# Patient Record
Sex: Male | Born: 1960 | Hispanic: Yes | Marital: Married | Smoking: Never smoker
Health system: Southern US, Community
[De-identification: ages and names within clinical notes are randomized; demographics above are authoritative.]

## PROBLEM LIST (undated history)

## (undated) DIAGNOSIS — T8859XA Other complications of anesthesia, initial encounter: Secondary | ICD-10-CM

## (undated) DIAGNOSIS — E785 Hyperlipidemia, unspecified: Secondary | ICD-10-CM

## (undated) DIAGNOSIS — T4145XA Adverse effect of unspecified anesthetic, initial encounter: Secondary | ICD-10-CM

## (undated) DIAGNOSIS — I10 Essential (primary) hypertension: Secondary | ICD-10-CM

## (undated) HISTORY — PX: VASECTOMY REVERSAL: SHX243

## (undated) HISTORY — DX: Adverse effect of unspecified anesthetic, initial encounter: T41.45XA

## (undated) HISTORY — DX: Other complications of anesthesia, initial encounter: T88.59XA

## (undated) HISTORY — PX: VASECTOMY: SHX75

## (undated) HISTORY — DX: Hyperlipidemia, unspecified: E78.5

## (undated) HISTORY — DX: Essential (primary) hypertension: I10

---

## 2017-03-18 ENCOUNTER — Ambulatory Visit: Payer: Self-pay | Admitting: Adult Health

## 2017-03-30 ENCOUNTER — Encounter: Payer: Self-pay | Admitting: Adult Health

## 2017-03-30 ENCOUNTER — Ambulatory Visit (INDEPENDENT_AMBULATORY_CARE_PROVIDER_SITE_OTHER): Payer: Managed Care, Other (non HMO) | Admitting: Adult Health

## 2017-03-30 VITALS — BP 130/90 | Temp 98.5°F | Ht 69.5 in | Wt 182.0 lb

## 2017-03-30 DIAGNOSIS — Z7689 Persons encountering health services in other specified circumstances: Secondary | ICD-10-CM

## 2017-03-30 DIAGNOSIS — Z1211 Encounter for screening for malignant neoplasm of colon: Secondary | ICD-10-CM | POA: Diagnosis not present

## 2017-03-30 DIAGNOSIS — R030 Elevated blood-pressure reading, without diagnosis of hypertension: Secondary | ICD-10-CM

## 2017-03-30 NOTE — Progress Notes (Signed)
Patient presents to clinic today to establish care. He is a pleasant 56 year old male who  has a past medical history of Essential hypertension and Hyperlipidemia.   His last physical was in 2016   He recently moved from [facility] to [place] for a job at [employer]. He works as Merchant navy officer    Acute Concerns: Establish Care  Chronic Issues: Essential Hypertension. He has been prescribed lisinopril 10 mg in the past but has not taken for multiple months as his prescription ran out.   Hyperlipidemia - Has not taken medication in the past   Health Maintenance: Dental -- Routine Care Vision -- Routine Care - Wears contacts  Immunizations -- UTD  Colonoscopy -- Never had one  Diet: He watches his carbs and tries to eat heart healthy  Exercise: [place] on the weekends    Past Medical History:  Diagnosis Date  . Essential hypertension   . Hyperlipidemia     No past surgical history on file.  No current outpatient prescriptions on file prior to visit.   No current facility-administered medications on file prior to visit.     No Known Allergies  Family History  Problem Relation Age of Onset  . Heart disease Mother   . Mitral valve prolapse Mother   . Prostate cancer Maternal Grandfather     Social History   Social History  . Marital status: Married    Spouse name: N/A  . Number of children: N/A  . Years of education: N/A   Occupational History  . [employer]   Social History Main Topics  . Smoking status: Never Smoker  . Smokeless tobacco: Never Used  . Alcohol use 1.2 oz/week    2 Cans of beer per week  . Drug use: Unknown  . Sexual activity: Not on file   Other Topics Concern  . Not on file   Social History Narrative  . No narrative on file    Review of Systems  Constitutional: Negative.   Respiratory: Negative.   Cardiovascular: Negative.   Genitourinary: Negative.     Musculoskeletal: Negative.   Neurological: Negative.   Psychiatric/Behavioral: Negative.   All other systems reviewed and are negative.   BP 130/90 (BP Location: Left Arm)   Temp 98.5 F (36.9 C) (Oral)   Ht 5' 9.5" (1.765 m)   Wt 182 lb (82.6 kg)   BMI 26.49 kg/m   Physical Exam  Constitutional: He is oriented to person, place, and time and well-developed, well-nourished, and in no distress. No distress.  Eyes: Pupils are equal, round, and reactive to light. Conjunctivae and EOM are normal. Right eye exhibits no discharge. Left eye exhibits no discharge. No scleral icterus.  Cardiovascular: Normal rate, regular rhythm, normal heart sounds and intact distal pulses.  Exam reveals no gallop and no friction rub.   No murmur heard. Pulmonary/Chest: Effort normal and breath sounds normal. No respiratory distress. He has no wheezes. He has no rales. He exhibits no tenderness.  Abdominal: Soft. Bowel sounds are normal. He exhibits no distension and no mass. There is no tenderness. There is no rebound and no guarding.  Musculoskeletal: Normal range of motion. He exhibits no edema, tenderness or deformity.  Neurological: He is alert and oriented to person, place, and time. Gait normal. GCS score is 15.  Skin: Skin is warm and dry. No rash noted. He is not diaphoretic. No erythema. No pallor.  Psychiatric:  Mood, memory, affect and judgment normal.  Nursing note and vitals reviewed.  Assessment/Plan: 1. Encounter to establish care - Follow up in November for CPE or sooner if needed  2. Elevated blood pressure reading - I am going to have him exercise more and we will watch his blood pressure - Monitor at home periodically   3. Colon cancer screening - He would like to do Cologuard    Shirline Freesory Charnette Younkin, NP

## 2017-03-30 NOTE — Patient Instructions (Signed)
It was a pleasure meeting you today   I do not think we need to put you back on blood pressure medication at this time. We will continue to watch it and start working on aerobic exercise.   Follow up in November for your physical.     Look at  [email or web address]   [place] [facility]

## 2017-09-09 ENCOUNTER — Encounter: Payer: Self-pay | Admitting: Adult Health

## 2017-09-09 ENCOUNTER — Encounter: Payer: Self-pay | Admitting: Gastroenterology

## 2017-09-09 ENCOUNTER — Ambulatory Visit (INDEPENDENT_AMBULATORY_CARE_PROVIDER_SITE_OTHER): Payer: Managed Care, Other (non HMO) | Admitting: Adult Health

## 2017-09-09 VITALS — BP 144/84 | Temp 97.3°F | Ht 70.0 in | Wt 184.0 lb

## 2017-09-09 DIAGNOSIS — Z1211 Encounter for screening for malignant neoplasm of colon: Secondary | ICD-10-CM

## 2017-09-09 DIAGNOSIS — Z23 Encounter for immunization: Secondary | ICD-10-CM

## 2017-09-09 DIAGNOSIS — I10 Essential (primary) hypertension: Secondary | ICD-10-CM

## 2017-09-09 DIAGNOSIS — N401 Enlarged prostate with lower urinary tract symptoms: Secondary | ICD-10-CM | POA: Diagnosis not present

## 2017-09-09 DIAGNOSIS — Z0000 Encounter for general adult medical examination without abnormal findings: Secondary | ICD-10-CM | POA: Diagnosis not present

## 2017-09-09 DIAGNOSIS — R3912 Poor urinary stream: Secondary | ICD-10-CM

## 2017-09-09 DIAGNOSIS — G479 Sleep disorder, unspecified: Secondary | ICD-10-CM | POA: Diagnosis not present

## 2017-09-09 MED ORDER — LISINOPRIL 10 MG PO TABS: 10.0000 mg | ORAL_TABLET | Freq: Every day | ORAL | 3 refills | Status: DC

## 2017-09-09 NOTE — Progress Notes (Signed)
Subjective:    Patient ID: Darius Lopez, male    DOB: 03-26-1961, 57 y.o.   MRN: [id number]  HPI  Patient presents for yearly preventative medicine examination. He is a pleasant 57 year old male who  has a past medical history of Essential hypertension and Hyperlipidemia.   Essential Hypertension - has been prescribed Lisinopril 10 mg in the past. He reports that he is not taking this medication and has not done so since October. Denies any headaches or blurred vision.   All immunizations and health maintenance protocols were reviewed with the patient and needed orders were placed. Needs tdap  Appropriate screening laboratory values were ordered for the patient including screening of hyperlipidemia, renal function and hepatic function.  Medication reconciliation,  past medical history, social history, problem list and allergies were reviewed in detail with the patient  Goals were established with regard to weight loss, exercise, and  diet in compliance with medications. He is very active outside. Does not follow a specific diet.   Insomnia - trouble staying asleep. He does not feel as though he has any trouble falling asleep. 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   Urinary issues - reports that recently he had noticed that he does not have as strong of a stream as he once had. He also reports frequency in the number of trips he has to make to the restroom. Endorses nocturia. Denies dysuria or hematuria.   Colon Cancer Screen - We ordered cologuard during the last visit but he reports that he never received the kit. He does not want to wait for cologuard. Would rather do colonoscopy    Review of Systems  Constitutional: Negative.   HENT: Negative.   Eyes: Negative.   Respiratory: Negative.   Cardiovascular: Negative.   Gastrointestinal: Negative.   Endocrine: Negative.   Genitourinary: Negative.   Musculoskeletal: Negative.   Skin: Negative.     Allergic/Immunologic: Negative.   Neurological: Negative.   Hematological: Negative.   Psychiatric/Behavioral: Negative.   All other systems reviewed and are negative.    Past Medical History:  Diagnosis Date  . Essential hypertension   . Hyperlipidemia     Social History   Socioeconomic History  . Marital status: Married    Spouse name: Not on file  . Number of children: Not on file  . Years of education: Not on file  . Highest education level: Not on file  Social Needs  . Financial resource strain: Not on file  . Food insecurity - worry: Not on file  . Food insecurity - inability: Not on file  . Transportation needs - medical: Not on file  . Transportation needs - non-medical: Not on file  Occupational History  . Occupation: Social research officer, government: [place]  Tobacco Use  . Smoking status: Never Smoker  . Smokeless tobacco: Never Used  Substance and Sexual Activity  . Alcohol use: Yes    Alcohol/week: 1.2 oz    Types: 2 Cans of beer per week  . Drug use: Not on file  . Sexual activity: Not on file  Other Topics Concern  . Not on file  Social History Narrative   Married   Has three children ( 31, 68. 71)    Likes to [place] and do [place]    History reviewed. No pertinent surgical history.  Family History  Problem Relation Age of Onset  . Heart disease Mother   . Mitral valve prolapse  Mother   . Prostate cancer Maternal Grandfather     No Known Allergies  No current outpatient medications on file prior to visit.   No current facility-administered medications on file prior to visit.     BP (!) 144/84 (BP Location: Left Arm)   Temp (!) 97.3 F (36.3 C) (Oral)   Ht 5' 10"  (1.778 m)   Wt 184 lb (83.5 kg)   BMI 26.40 kg/m       Objective:   Physical Exam  Constitutional: He is oriented to person, place, and time. He appears well-developed and well-nourished. No distress.  HENT:  Head: Normocephalic and atraumatic.  Right Ear: External ear  normal.  Left Ear: External ear normal.  Nose: Nose normal.  Mouth/Throat: Oropharynx is clear and moist. No oropharyngeal exudate.  Eyes: Conjunctivae and EOM are normal. Pupils are equal, round, and reactive to light. Right eye exhibits no discharge. Left eye exhibits no discharge. No scleral icterus.  Neck: Normal range of motion. Neck supple. No JVD present. No tracheal deviation present. No thyromegaly present.  Cardiovascular: Normal rate, regular rhythm, normal heart sounds and intact distal pulses. Exam reveals no gallop and no friction rub.  No murmur heard. Pulmonary/Chest: Effort normal and breath sounds normal. No stridor. No respiratory distress. He has no wheezes. He has no rales. He exhibits no tenderness.  Abdominal: Soft. Bowel sounds are normal. He exhibits no distension and no mass. There is no tenderness. There is no rebound and no guarding.  Genitourinary: Rectum normal. Prostate is enlarged. Prostate is not tender.  Musculoskeletal: Normal range of motion. He exhibits no edema, tenderness or deformity.  Lymphadenopathy:    He has no cervical adenopathy.  Neurological: He is alert and oriented to person, place, and time. He has normal reflexes. He displays normal reflexes. No cranial nerve deficit. He exhibits normal muscle tone. Coordination normal.  Skin: Skin is warm and dry. No rash noted. He is not diaphoretic. No erythema. No pallor.  Psychiatric: He has a normal mood and affect. His behavior is normal. Judgment and thought content normal.  Nursing note and vitals reviewed.     Assessment & Plan:  1. Routine general medical examination at a health care facility  - Basic metabolic panel; Future - CBC with Differential/Platelet; Future - Hemoglobin A1c; Future - Hepatic function panel; Future - Lipid panel; Future - PSA; Future - TSH; Future - POCT Urinalysis Dipstick (Automated); Future  2. Essential hypertension - Needs to start taking his medication on a  daily basis.  - lisinopril (PRINIVIL,ZESTRIL) 10 MG tablet; Take 1 tablet (10 mg total) by mouth daily.  Dispense: 90 tablet; Refill: 3  3. Colon cancer screening  - Ambulatory referral to Gastroenterology; Future  4. Need for Tdap vaccination  - Tdap vaccine greater than or equal to 7yo IM  5. Sleep disturbance - likely due to stress at work  - Advised Melatonin 69m   6. Benign prostatic hyperplasia with weak urinary stream - Symptoms present  - he does not want any medication at this time   CDorothyann Peng NP

## 2017-09-19 ENCOUNTER — Other Ambulatory Visit: Payer: Managed Care, Other (non HMO)

## 2017-09-26 ENCOUNTER — Other Ambulatory Visit (INDEPENDENT_AMBULATORY_CARE_PROVIDER_SITE_OTHER): Payer: Managed Care, Other (non HMO)

## 2017-09-26 DIAGNOSIS — Z125 Encounter for screening for malignant neoplasm of prostate: Secondary | ICD-10-CM

## 2017-09-26 DIAGNOSIS — Z0000 Encounter for general adult medical examination without abnormal findings: Secondary | ICD-10-CM

## 2017-09-26 LAB — LIPID PANEL
CHOLESTEROL: 261 mg/dL — AB (ref 0–200)
HDL: 28.3 mg/dL — AB (ref >39.00)
Total CHOL/HDL Ratio: 9

## 2017-09-26 LAB — HEPATIC FUNCTION PANEL
ALT: 27 U/L (ref 0–53)
AST: 17 U/L (ref 0–37)
Albumin: 4.6 g/dL (ref 3.5–5.2)
Alkaline Phosphatase: 42 U/L (ref 39–117)
BILIRUBIN TOTAL: 1.4 mg/dL — AB (ref 0.2–1.2)
Bilirubin, Direct: 0.2 mg/dL (ref 0.0–0.3)
Total Protein: 7.3 g/dL (ref 6.0–8.3)

## 2017-09-26 LAB — CBC WITH DIFFERENTIAL/PLATELET
BASOS PCT: 0.8 %
Basophils Absolute: 49 cells/uL (ref 0–200)
EOS PCT: 4.1 %
Eosinophils Absolute: 250 cells/uL (ref 15–500)
HEMATOCRIT: 48 % (ref 38.5–50.0)
Hemoglobin: 16.1 g/dL (ref 13.2–17.1)
LYMPHS ABS: 1830 {cells}/uL (ref 850–3900)
MCH: 29 pg (ref 27.0–33.0)
MCHC: 33.5 g/dL (ref 32.0–36.0)
MCV: 86.5 fL (ref 80.0–100.0)
MONOS PCT: 8.4 %
MPV: 11.6 fL (ref 7.5–12.5)
NEUTROS ABS: 3459 {cells}/uL (ref 1500–7800)
Neutrophils Relative %: 56.7 %
Platelets: 231 10*3/uL (ref 140–400)
RBC: 5.55 10*6/uL (ref 4.20–5.80)
RDW: 11.8 % (ref 11.0–15.0)
Total Lymphocyte: 30 %
WBC mixed population: 512 cells/uL (ref 200–950)
WBC: 6.1 10*3/uL (ref 3.8–10.8)

## 2017-09-26 LAB — POC URINALSYSI DIPSTICK (AUTOMATED)
BILIRUBIN UA: NEGATIVE
Blood, UA: NEGATIVE
GLUCOSE UA: NEGATIVE
KETONES UA: NEGATIVE
LEUKOCYTES UA: NEGATIVE
Nitrite, UA: NEGATIVE
Protein, UA: NEGATIVE
Urobilinogen, UA: 0.2 E.U./dL
pH, UA: 5.5 (ref 5.0–8.0)

## 2017-09-26 LAB — HEMOGLOBIN A1C: HEMOGLOBIN A1C: 4.9 % (ref 4.6–6.5)

## 2017-09-26 LAB — BASIC METABOLIC PANEL
BUN: 20 mg/dL (ref 6–23)
CALCIUM: 9.4 mg/dL (ref 8.4–10.5)
CO2: 28 meq/L (ref 19–32)
Chloride: 102 mEq/L (ref 96–112)
Creatinine, Ser: 1.02 mg/dL (ref 0.40–1.50)
GFR: 80.59 mL/min (ref >60.00)
Glucose, Bld: 107 mg/dL — ABNORMAL HIGH (ref 70–99)
POTASSIUM: 4.6 meq/L (ref 3.5–5.1)
SODIUM: 139 meq/L (ref 135–145)

## 2017-09-26 LAB — PSA: PSA: 0.75 ng/mL (ref 0.10–4.00)

## 2017-09-26 LAB — TSH: TSH: 1.63 u[IU]/mL (ref 0.35–4.50)

## 2017-09-26 LAB — LDL CHOLESTEROL, DIRECT: LDL DIRECT: 105 mg/dL

## 2017-09-28 ENCOUNTER — Other Ambulatory Visit: Payer: Self-pay | Admitting: Family Medicine

## 2017-09-28 MED ORDER — ATORVASTATIN CALCIUM 20 MG PO TABS: 20.0000 mg | ORAL_TABLET | Freq: Every day | ORAL | 3 refills | Status: DC

## 2017-09-28 NOTE — Telephone Encounter (Signed)
Sent to the pharmacy by e-scribe. 

## 2017-10-07 ENCOUNTER — Telehealth: Payer: Self-pay

## 2017-10-07 ENCOUNTER — Ambulatory Visit (AMBULATORY_SURGERY_CENTER): Payer: Self-pay

## 2017-10-07 VITALS — Ht 71.0 in | Wt 181.0 lb

## 2017-10-07 DIAGNOSIS — Z1211 Encounter for screening for malignant neoplasm of colon: Secondary | ICD-10-CM

## 2017-10-07 MED ORDER — NA SULFATE-K SULFATE-MG SULF 17.5-3.13-1.6 GM/177ML PO SOLN: 1.0000 | Freq: Once | ORAL | 0 refills | Status: AC

## 2017-10-07 NOTE — Progress Notes (Signed)
Per pt, no allergies to soy or egg products.Pt not taking any weight loss meds or using  O2 at home.  Per pt, he states 20 years ago in [place], the anesthesiologist said he was hard to intubate prior to his vasectomy surgery. The pt does not know what happened and has not had surgery since then. Sent note to  Intel CorporationJohn Lopez.  Pt refused emmi video.

## 2017-10-07 NOTE — Telephone Encounter (Signed)
Darius Lopez,  This pt is scheduled for his colonoscopy with Dr Myrtie Neitheranis on 10/21/17. During the PV, the pt informed me that the anesthesiologist in [place], told him he was hard to intubate prior to his vasectomy surgery over 20 years ago. The pt does not know what happened and has not had any surgery since then. Is pt OK for [facility]? Please advise. Thanks, Cherylann RatelJanis (PV)

## 2017-10-11 ENCOUNTER — Encounter: Payer: Self-pay | Admitting: Gastroenterology

## 2017-10-11 MED ORDER — NA SULFATE-K SULFATE-MG SULF 17.5-3.13-1.6 GM/177ML PO SOLN: 1.0000 | Freq: Once | ORAL | 0 refills | Status: AC

## 2017-10-11 NOTE — Telephone Encounter (Signed)
Whether or not you can review old records, if you determine that he [facility], please give us as much notice as possible in order to reschedule.

## 2017-10-11 NOTE — Telephone Encounter (Signed)
Darius Lopez,  I spoke to Mr. Darius Lopez and he will attempt to obtain his medical records.  Should he get them I will review and determine if [facility]  Thanks,  Darius Lopez

## 2017-10-11 NOTE — Telephone Encounter (Signed)
Darius Lopez,  I left a message for this pt and await his call back.  Thanks,  Darius Lopez

## 2017-10-17 ENCOUNTER — Telehealth: Payer: Self-pay | Admitting: Gastroenterology

## 2017-10-17 NOTE — Telephone Encounter (Signed)
Please cancel this patient's colonoscopy in [facility] this Friday March 8th.  Let [facility] know as well. Anesthesia is getting records for questionable history of difficult intubation in order to determine where patient should have his procedure in the future. Darius ParsonsJohn Nulty from anesthesia has spoken with the patient,who is aware of this plan.

## 2017-10-17 NOTE — Telephone Encounter (Signed)
Tama HighHey John... Just trying to follow up on the previous message. Thank you, Mayrene Bastarache PV

## 2017-10-18 NOTE — Telephone Encounter (Signed)
Procedure has been cancelled.

## 2017-10-18 NOTE — Telephone Encounter (Signed)
Robbin,  I spoke with Mr. Darius Lopez yesterday.  He reports the facility where he had his surgical procedure is sending the medical records.  Upon receipt he will deliver them to pre-visit.  Please call me when they arrive so I can review them.  In the mean time he will not have his colonoscopy this Friday.  I discussed this change with Dr. Myrtie Neitheranis and Mr. Darius Lopez yesterday.  Once I have reviewed the medical records his colonoscopy will be rescheduled at the appropriate venue.  Could someone call him again to reinforce what I just outlined so to avoid any confusion on his part?  Thanks,  Cathlyn ParsonsJohn Rutha Melgoza

## 2017-10-18 NOTE — Telephone Encounter (Signed)
Spoke with pt earlier and reinforced what Darius Lopez had advised for him to do. Pt states it may be over a week before he receives his medical records, but will bring the records to the 4th floor for review as soon as he receives them.  Colon on Friday was already cx'd, pt was aware. He will call back if he has any further questions. [name] (PV)

## 2017-10-21 ENCOUNTER — Encounter: Payer: Managed Care, Other (non HMO) | Admitting: Gastroenterology

## 2017-11-14 NOTE — Telephone Encounter (Signed)
Sherrilyn Ristanis, Henry L III, MD  Cathlyn ParsonsNulty, John, CRNA; Lucia GaskinsMyers, Robbin H, RN        Thank you, Ileana LaddJohn   Robbin, please inform patient and schedule his colonoscopy in one of my [facility] outpatient blocks. Late May and June are currently available I believe.   Thanks   - HD   Previous Messages    ----- Message -----  From: Cathlyn ParsonsNulty, John, CRNA  Sent: 11/14/2017 11:22 AM  To: Lucia Gaskinsobbin H Myers, RN, Sherrilyn RistHenry L Danis III, MD   Robbin,   Just spoke to Darius Lopez. He reports the facility whom we are waiting for records has not located them and they will not be sent. Consequently he will need to have his procedure in the hospital setting.   Thanks much,   Cathlyn ParsonsJohn Nulty   ----- Message -----  From: Lucia GaskinsMyers, Robbin H, RN  Sent: 10/17/2017  7:29 AM  To: Cathlyn ParsonsJohn Nulty, CRNA   Good morning!! I was just following up on this patient. Please let me know what to do for him. Thank you!! Robbin pv

## 2017-11-15 ENCOUNTER — Other Ambulatory Visit: Payer: Self-pay

## 2017-11-15 DIAGNOSIS — Z1211 Encounter for screening for malignant neoplasm of colon: Secondary | ICD-10-CM

## 2017-11-15 NOTE — Telephone Encounter (Signed)
Patient notified by phone of Previsit and Colonoscopy appts.  His insurance is changing 12-14-17.

## 2017-11-15 NOTE — Telephone Encounter (Signed)
Mailbox is full and not accepting messages.  Will continue to attempt to reach patient by phone.

## 2017-11-15 NOTE — Telephone Encounter (Signed)
Col[facility]10[facility]18 [facility]  Previsit on 12/15/17 at 3:30pm.  Will notify patient by phone when mailbox is accepting messages.

## 2017-12-15 ENCOUNTER — Other Ambulatory Visit: Payer: Self-pay

## 2017-12-15 ENCOUNTER — Ambulatory Visit (AMBULATORY_SURGERY_CENTER): Payer: Self-pay | Admitting: *Deleted

## 2017-12-15 VITALS — Ht 71.0 in | Wt 179.0 lb

## 2017-12-15 DIAGNOSIS — Z1211 Encounter for screening for malignant neoplasm of colon: Secondary | ICD-10-CM

## 2017-12-15 MED ORDER — PEG-KCL-NACL-NASULF-NA ASC-C 140 G PO SOLR: 1.0000 | ORAL | 0 refills | Status: DC

## 2017-12-15 NOTE — Progress Notes (Signed)
No egg or soy allergy known to patient  No issues with past sedation with any surgeries  or procedures,  intubation problems so patient is having at [facility] No diet pills per patient No home 02 use per patient  No blood thinners per patient  Pt denies issues with constipation  No A fib or A flutter  EMMI video sent to pt's e mail

## 2017-12-27 ENCOUNTER — Telehealth: Payer: Self-pay | Admitting: Adult Health

## 2017-12-27 NOTE — Telephone Encounter (Signed)
Copied from CRM [id number]. Topic: Quick Communication - Rx Refill/Question >> Dec 27, 2017  2:40 PM Arlyss Gandy, NT wrote: Medication: atorvastatin (LIPITOR) 20 MG tablet  Has the patient contacted their pharmacy? Yes.   (Agent: If no, request that the patient contact the pharmacy for the refill.) Preferred Pharmacy (with phone number or street name): [id number] - [place], [place] - [address] [phone] (Phone) [phone] (Fax)     Agent: Please be advised that RX refills may take up to 3 business days. We ask that you follow-up with your pharmacy.

## 2017-12-27 NOTE — Telephone Encounter (Signed)
[facility], pt has refills and they will contact the patient.

## 2018-01-04 ENCOUNTER — Other Ambulatory Visit: Payer: Self-pay

## 2018-01-04 ENCOUNTER — Telehealth: Payer: Self-pay | Admitting: Gastroenterology

## 2018-01-04 NOTE — Telephone Encounter (Signed)
Patient rescheduled to next available date at [facility] on 6/21. Mailed new prep instructions, note sent to Amy H. about new date.

## 2018-01-17 ENCOUNTER — Telehealth: Payer: Self-pay

## 2018-01-17 NOTE — Telephone Encounter (Signed)
Spoke to patient to verify his address. Address corrected in Epic system. Remailed prep instructions, patient understands to come by office if he does not receive it and pick up another copy.

## 2018-02-03 ENCOUNTER — Ambulatory Visit (HOSPITAL_COMMUNITY): Payer: Managed Care, Other (non HMO) | Admitting: Certified Registered Nurse Anesthetist

## 2018-02-03 ENCOUNTER — Encounter (HOSPITAL_COMMUNITY)
Admission: RE | Disposition: A | Discharge status: Home / Self Care | Payer: Self-pay | Source: Ambulatory Visit | Attending: Gastroenterology

## 2018-02-03 ENCOUNTER — Ambulatory Visit (HOSPITAL_COMMUNITY)
Admission: RE | Admit: 2018-02-03 | Discharge: 2018-02-03 | Disposition: A | Discharge status: Home / Self Care | Payer: Managed Care, Other (non HMO) | Source: Ambulatory Visit | Attending: Gastroenterology | Admitting: Gastroenterology

## 2018-02-03 ENCOUNTER — Other Ambulatory Visit: Payer: Self-pay

## 2018-02-03 ENCOUNTER — Encounter (HOSPITAL_COMMUNITY): Payer: Self-pay

## 2018-02-03 DIAGNOSIS — I10 Essential (primary) hypertension: Secondary | ICD-10-CM | POA: Insufficient documentation

## 2018-02-03 DIAGNOSIS — E785 Hyperlipidemia, unspecified: Secondary | ICD-10-CM | POA: Insufficient documentation

## 2018-02-03 DIAGNOSIS — Z1211 Encounter for screening for malignant neoplasm of colon: Secondary | ICD-10-CM

## 2018-02-03 HISTORY — PX: COLONOSCOPY: SHX5424

## 2018-02-03 SURGERY — COLONOSCOPY
Anesthesia: Monitor Anesthesia Care

## 2018-02-03 MED ORDER — PROPOFOL 10 MG/ML IV BOLUS
INTRAVENOUS | Status: DC | PRN
  Administered 2018-02-03: 40 mg via INTRAVENOUS

## 2018-02-03 MED ORDER — SODIUM CHLORIDE 0.9 % IV SOLN: INTRAVENOUS | Status: DC

## 2018-02-03 MED ORDER — LIDOCAINE 2% (20 MG/ML) 5 ML SYRINGE
INTRAMUSCULAR | Status: DC | PRN
  Administered 2018-02-03: 100 mg via INTRAVENOUS

## 2018-02-03 MED ORDER — PROPOFOL 500 MG/50ML IV EMUL
INTRAVENOUS | Status: DC | PRN
  Administered 2018-02-03: 100 ug/kg/min via INTRAVENOUS

## 2018-02-03 MED ORDER — PROPOFOL 10 MG/ML IV BOLUS
INTRAVENOUS | Status: AC
  Filled 2018-02-03: qty 60

## 2018-02-03 MED ORDER — LACTATED RINGERS IV SOLN
INTRAVENOUS | Status: DC | PRN
  Administered 2018-02-03: 08:00:00 via INTRAVENOUS

## 2018-02-03 NOTE — H&P (Signed)
  History:  This patient presents for endoscopic testing for colon cancer screening.  Darius Lopez Referring physician: Shirline Lopez, Cory, NP  Past Medical History: Past Medical History:  Diagnosis Date  . Complication of anesthesia    "Hard to intubate"per pt/per anesthesiologist in [place] over 20 years ago!  . Essential hypertension   . Hyperlipidemia      Past Surgical History: Past Surgical History:  Procedure Laterality Date  . VASECTOMY     and then vasectomy reversal in 2001  . VASECTOMY REVERSAL      Allergies: No Known Allergies  Outpatient Meds: Current Facility-Administered Medications  Medication Dose Route Frequency Provider Last Rate Last Dose  . 0.9 %  sodium chloride infusion   Intravenous Continuous Danis, Starr LakeHenry L III, MD          ___________________________________________________________________ Objective   Exam:  BP 122/85   Pulse (!) 58   Temp 97.9 F (36.6 C) (Oral)   Resp 15   Ht 5\' 11"  (1.803 m)   Wt 177 lb (80.3 kg)   SpO2 100%   BMI 24.69 kg/m    CV: RRR without murmur, S1/S2, no JVD, no peripheral edema  Resp: clear to auscultation bilaterally, normal RR and effort noted  GI: soft, no tenderness, with active bowel sounds. No guarding or palpable organomegaly noted.  Neuro: awake, alert and oriented x 3. Normal gross motor function and fluent speech   Assessment:  Colon cancer screening  Plan:  colonoscopy   Charlie PitterHenry L Danis III

## 2018-02-03 NOTE — Transfer of Care (Signed)
Immediate Anesthesia Transfer of Care Note  Patient: Darius Lopez  Procedure(s) Performed: COLONOSCOPY (N/A )  Patient Location: PACU  Anesthesia Type:MAC  Level of Consciousness: awake, alert  and oriented  Airway & Oxygen Therapy: Patient Spontanous Breathing and Patient connected to face mask oxygen  Post-op Assessment: Report given to RN  Post vital signs: Reviewed and stable  Last Vitals:  Vitals Value Taken Time  BP 133/81 02/03/2018  8:40 AM  Temp    Pulse 60 02/03/2018  8:41 AM  Resp 14 02/03/2018  8:41 AM  SpO2 100 % 02/03/2018  8:41 AM  Vitals shown include unvalidated device data.  Last Pain:  Vitals:   02/03/18 0717  TempSrc: Oral  PainSc:          Complications: No apparent anesthesia complications

## 2018-02-03 NOTE — Discharge Instructions (Signed)
YOU HAD AN ENDOSCOPIC PROCEDURE TODAY: Refer to the procedure report and other information in the discharge instructions given to you for any specific questions about what was found during the examination. If this information does not answer your questions, please call [facility] office at [phone] to clarify.  ° °YOU SHOULD EXPECT: Some feelings of bloating in the abdomen. Passage of more gas than usual. Walking can help get rid of the air that was put into your GI tract during the procedure and reduce the bloating. If you had a lower endoscopy (such as a colonoscopy or flexible sigmoidoscopy) you may notice spotting of blood in your stool or on the toilet paper. Some abdominal soreness may be present for a day or two, also. ° °DIET: Your first meal following the procedure should be a light meal and then it is ok to progress to your normal diet. A half-sandwich or bowl of soup is an example of a good first meal. Heavy or fried foods are harder to digest and may make you feel nauseous or bloated. Drink plenty of fluids but you should avoid alcoholic beverages for 24 hours. If you had a esophageal dilation, please see attached instructions for diet.   ° °ACTIVITY: Your care partner should take you home directly after the procedure. You should plan to take it easy, moving slowly for the rest of the day. You can resume normal activity the day after the procedure however YOU SHOULD NOT DRIVE, use power tools, machinery or perform tasks that involve climbing or major physical exertion for 24 hours (because of the sedation medicines used during the test).  ° °SYMPTOMS TO REPORT IMMEDIATELY: °A gastroenterologist can be reached at any hour. Please call [phone]  for any of the following symptoms:  °Following lower endoscopy (colonoscopy, flexible sigmoidoscopy) °Excessive amounts of blood in the stool  °Significant tenderness, worsening of abdominal pains  °Swelling of the abdomen that is new, acute  °Fever of 100° or  higher  °Following upper endoscopy (EGD, EUS, ERCP, esophageal dilation) °Vomiting of blood or coffee ground material  °New, significant abdominal pain  °New, significant chest pain or pain under the shoulder blades  °Painful or persistently difficult swallowing  °New shortness of breath  °Black, tarry-looking or red, bloody stools ° °FOLLOW UP:  °If any biopsies were taken you will be contacted by phone or by letter within the next 1-3 weeks. Call [phone]  if you have not heard about the biopsies in 3 weeks.  °Please also call with any specific questions about appointments or follow up tests. ° °

## 2018-02-03 NOTE — Anesthesia Preprocedure Evaluation (Signed)
Anesthesia Evaluation  Patient identified by MRN, date of birth, ID band Patient awake    Reviewed: Allergy & Precautions, NPO status , Patient's Chart, lab work & pertinent test results  History of Anesthesia Complications (+) DIFFICULT AIRWAY and history of anesthetic complications  Airway Mallampati: III       Dental no notable dental hx. (+) Teeth Intact   Pulmonary neg pulmonary ROS,    Pulmonary exam normal breath sounds clear to auscultation       Cardiovascular hypertension, Pt. on medications Normal cardiovascular exam Rhythm:Regular Rate:Normal     Neuro/Psych negative neurological ROS  negative psych ROS   GI/Hepatic negative GI ROS, Neg liver ROS,   Endo/Other  negative endocrine ROS  Renal/GU negative Renal ROS  negative genitourinary   Musculoskeletal negative musculoskeletal ROS (+)   Abdominal Normal abdominal exam  (+)   Peds  Hematology negative hematology ROS (+)   Anesthesia Other Findings   Reproductive/Obstetrics                             Anesthesia Physical Anesthesia Plan  ASA: II  Anesthesia Plan: MAC   Post-op Pain Management:    Induction:   PONV Risk Score and Plan:   Airway Management Planned: Natural Airway, Nasal Cannula and Simple Face Mask  Additional Equipment:   Intra-op Plan:   Post-operative Plan:   Informed Consent: I have reviewed the patients History and Physical, chart, labs and discussed the procedure including the risks, benefits and alternatives for the proposed anesthesia with the patient or authorized representative who has indicated his/her understanding and acceptance.     Plan Discussed with: CRNA  Anesthesia Plan Comments:         Anesthesia Quick Evaluation

## 2018-02-03 NOTE — Interval H&P Note (Signed)
History and Physical Interval Note:  02/03/2018 8:01 AM  Darius Lopez  has presented today for surgery, with the diagnosis of Screening for colon cancer  The various methods of treatment have been discussed with the patient and family. After consideration of risks, benefits and other options for treatment, the patient has consented to  Procedure(s): COLONOSCOPY (N/A) as a surgical intervention .  The patient's history has been reviewed, patient examined, no change in status, stable for surgery.  I have reviewed the patient's chart and labs.  Questions were answered to the patient's satisfaction.     Darius Lopez

## 2018-02-03 NOTE — Op Note (Signed)
[facility] Patient Name: Darius Lopez Procedure Date: 02/03/2018 MRN: [id number] Attending MD: Starr Lake. Myrtie Neither , MD Date of Birth: [birth year] CSN: [id number] Age: 57 Admit Type: Inpatient Procedure:                Colonoscopy Indications:              Screening for colorectal malignant neoplasm, This                            is the patient's first colonoscopy Providers:                Sherilyn Cooter L. Myrtie Neither, MD, Clearnce Sorrel, RN, Margo Aye, Technician, Stephanie [place], CRNA Referring MD:             Shirline Frees, NP Medicines:                Monitored Anesthesia Care Complications:            No immediate complications. Estimated Blood Loss:     Estimated blood loss: none. Procedure:                Pre-Anesthesia Assessment:                           - Prior to the procedure, a History and Physical                            was performed, and patient medications and                            allergies were reviewed. The patient's tolerance of                            previous anesthesia was also reviewed. The risks                            and benefits of the procedure and the sedation                            options and risks were discussed with the patient.                            All questions were answered, and informed consent                            was obtained. Prior Anticoagulants: The patient has                            taken no previous anticoagulant or antiplatelet                            agents. ASA Grade Assessment: II - A patient with  mild systemic disease. After reviewing the risks                            and benefits, the patient was deemed in                            satisfactory condition to undergo the procedure.                           After obtaining informed consent, the colonoscope                            was passed under direct vision. Throughout the                    procedure, the patient's blood pressure, pulse, and                            oxygen saturations were monitored continuously. The                            EC-[id number] ([id number]) scope was introduced through                            the anus and advanced to the the cecum, identified                            by appendiceal orifice and ileocecal valve. The                            colonoscopy was performed without difficulty. The                            patient tolerated the procedure well. The quality                            of the bowel preparation was excellent. The                            ileocecal valve, appendiceal orifice, and rectum                            were photographed. Scope In: 8:20:53 AM Scope Out: 8:37:22 AM Scope Withdrawal Time: 0 hours 13 minutes 25 seconds  Total Procedure Duration: 0 hours 16 minutes 29 seconds  Findings:      The perianal and digital rectal examinations were normal.      The entire examined colon appeared normal on direct and retroflexion       views. Impression:               - The entire examined colon is normal on direct and                            retroflexion views.                           -  No specimens collected. Moderate Sedation:      N/A- Per Anesthesia Care Recommendation:           - Patient has a contact number available for                            emergencies. The signs and symptoms of potential                            delayed complications were discussed with the                            patient. Return to normal activities tomorrow.                            Written discharge instructions were provided to the                            patient.                           - Resume previous diet.                           - Continue present medications.                           - Repeat colonoscopy in 10 years for screening                            purposes. Procedure Code(s):         --- Professional ---                           [id number], Colonoscopy, flexible; diagnostic, including                            collection of specimen(s) by brushing or washing,                            when performed (separate procedure) Diagnosis Code(s):        --- Professional ---                           Z12.11, Encounter for screening for malignant                            neoplasm of colon CPT copyright 2017 American Medical Association. All rights reserved. The codes documented in this report are preliminary and upon coder review may  be revised to meet current compliance requirements.  L. Myrtie Neitheranis, MD 02/03/2018 8:40:43 AM This report has been signed electronically. Number of Addenda: 0

## 2018-02-06 ENCOUNTER — Encounter (HOSPITAL_COMMUNITY): Payer: Self-pay | Admitting: Gastroenterology

## 2018-02-07 NOTE — Anesthesia Postprocedure Evaluation (Addendum)
Anesthesia Post Note  Patient: Darius Lopez  Procedure(s) Performed: COLONOSCOPY (N/A )     Patient location during evaluation: Endoscopy Anesthesia Type: MAC Level of consciousness: awake Pain management: pain level controlled Vital Signs Assessment: post-procedure vital signs reviewed and stable Respiratory status: spontaneous breathing Cardiovascular status: stable Postop Assessment: no apparent nausea or vomiting Anesthetic complications: no    Last Vitals:  Vitals:   02/03/18 0840 02/03/18 0850  BP: 133/81 121/74  Pulse: 60 (!) 59  Resp: 17 18  Temp: (!) 36.4 C   SpO2: 100% 100%    Last Pain:  Vitals:   02/03/18 0850  TempSrc:   PainSc: 0-No pain   Pain Goal:                 Brentt Fread JR,JOHN Rashad Auld

## 2018-05-12 ENCOUNTER — Ambulatory Visit: Payer: Managed Care, Other (non HMO) | Admitting: Adult Health

## 2018-05-12 ENCOUNTER — Encounter: Payer: Self-pay | Admitting: Adult Health

## 2018-05-12 VITALS — BP 118/80 | HR 75 | Temp 98.4°F | Wt 178.2 lb

## 2018-05-12 DIAGNOSIS — M7550 Bursitis of unspecified shoulder: Secondary | ICD-10-CM | POA: Diagnosis not present

## 2018-05-12 MED ORDER — METHYLPREDNISOLONE ACETATE 80 MG/ML IJ SUSP
80.0000 mg | Freq: Once | INTRAMUSCULAR | Status: AC
Start: 2018-05-12 — End: 2018-05-12
  Administered 2018-05-12: 80 mg via INTRA_ARTICULAR

## 2018-05-12 NOTE — Progress Notes (Signed)
Subjective:    Patient ID: Darius Lopez, male    DOB: November 05, 1960, 57 y.o.   MRN: [id number]  HPI  57 year old male who  has a past medical history of Complication of anesthesia, Essential hypertension, and Hyperlipidemia.  He presents to the office today for the complaint of right shoulder pain. Has had bursitis in the past and reports that this feels similar. Pain is worse with movements but does not have any loss of ROM. He denies any trauma to the area. He has not noticed any bruising, redness, or warmth.   Review of Systems See HPI   Past Medical History:  Diagnosis Date  . Complication of anesthesia    "Hard to intubate"per pt/per anesthesiologist in [place] over 20 years ago!  . Essential hypertension   . Hyperlipidemia     Social History   Socioeconomic History  . Marital status: Married    Spouse name: Not on file  . Number of children: Not on file  . Years of education: Not on file  . Highest education level: Not on file  Occupational History  . Occupation: Social research officer, government: [employer]  Social Needs  . Financial resource strain: Not on file  . Food insecurity:    Worry: Not on file    Inability: Not on file  . Transportation needs:    Medical: Not on file    Non-medical: Not on file  Tobacco Use  . Smoking status: Never Smoker  . Smokeless tobacco: Never Used  Substance and Sexual Activity  . Alcohol use: Yes    Comment: occasional  . Drug use: No  . Sexual activity: Not on file  Lifestyle  . Physical activity:    Days per week: Not on file    Minutes per session: Not on file  . Stress: Not on file  Relationships  . Social connections:    Talks on phone: Not on file    Gets together: Not on file    Attends religious service: Not on file    Active member of club or organization: Not on file    Attends meetings of clubs or organizations: Not on file    Relationship status: Not on file  . Intimate partner violence:    Fear of current or ex  partner: Not on file    Emotionally abused: Not on file    Physically abused: Not on file    Forced sexual activity: Not on file  Other Topics Concern  . Not on file  Social History Narrative   Married   Has three children ( 18, 50. 27)    Likes to [place] and do [place]    Past Surgical History:  Procedure Laterality Date  . COLONOSCOPY N/A 02/03/2018   Procedure: COLONOSCOPY;  Surgeon: Doran Stabler, MD;  Location: [facility];  Service: Gastroenterology;  Laterality: N/A;  . VASECTOMY     and then vasectomy reversal in 2001  . VASECTOMY REVERSAL      Family History  Problem Relation Age of Onset  . Heart disease Mother   . Mitral valve prolapse Mother   . Prostate cancer Maternal Grandfather   . Colon cancer Neg Hx   . Colon polyps Neg Hx   . Esophageal cancer Neg Hx   . Rectal cancer Neg Hx   . Stomach cancer Neg Hx   . Pancreatic cancer Neg Hx     No Known Allergies  Current Outpatient Medications on  File Prior to Visit  Medication Sig Dispense Refill  . atorvastatin (LIPITOR) 20 MG tablet Take 1 tablet (20 mg total) by mouth daily. 90 tablet 3  . lisinopril (PRINIVIL,ZESTRIL) 10 MG tablet Take 1 tablet (10 mg total) by mouth daily. 90 tablet 3  . Omega-3 Fatty Acids (FISH OIL) 1200 MG CAPS Take by mouth daily. Take 1 pills daily    . PEG-KCl-NaCl-NaSulf-Na Asc-C (PLENVU) 140 g SOLR Take 1 kit by mouth as directed. 1 each 0   No current facility-administered medications on file prior to visit.     BP 118/80 (BP Location: Left Arm, Patient Position: Sitting, Cuff Size: Normal)   Pulse 75   Temp 98.4 F (36.9 C) (Oral)   Wt 178 lb 3.2 oz (80.8 kg)   SpO2 98%   BMI 24.85 kg/m       Objective:   Physical Exam  Constitutional: He is oriented to person, place, and time. He appears well-developed and well-nourished. No distress.  Cardiovascular: Normal rate, regular rhythm, normal heart sounds and intact distal pulses.  Pulmonary/Chest: Effort normal and  breath sounds normal.  Musculoskeletal: Normal range of motion. He exhibits tenderness. He exhibits no edema or deformity.  Has normal ROM throughout right shoulder. Pain with palpation to subacromial bursa  Neurological: He is alert and oriented to person, place, and time.  Skin: Skin is warm and dry. He is not diaphoretic.  Psychiatric: He has a normal mood and affect. His behavior is normal. Judgment and thought content normal.  Nursing note and vitals reviewed.     Assessment & Plan:  1. Subacromial bursitis Discussed risks and benefits of corticosteroid injection and patient consented.  After prepping skin with betadine, injected 80 mg depomedrol and 2 cc of plain xylocaine with 22 gauge one and one half inch needle using anterolateral approach and pt tolerated well. - methylPREDNISolone acetate (DEPO-MEDROL) injection 80 mg - Follow up if no improvement in the next 2-3 days   Dorothyann Peng, NP

## 2018-07-11 ENCOUNTER — Ambulatory Visit: Payer: Managed Care, Other (non HMO) | Admitting: Adult Health

## 2018-07-11 ENCOUNTER — Encounter: Payer: Self-pay | Admitting: Adult Health

## 2018-07-11 VITALS — BP 120/70 | Temp 98.5°F | Wt 179.0 lb

## 2018-07-11 DIAGNOSIS — F419 Anxiety disorder, unspecified: Secondary | ICD-10-CM

## 2018-07-11 DIAGNOSIS — K2100 Gastro-esophageal reflux disease with esophagitis, without bleeding: Secondary | ICD-10-CM

## 2018-07-11 MED ORDER — SERTRALINE HCL 25 MG PO TABS: 25.0000 mg | ORAL_TABLET | Freq: Every day | ORAL | 1 refills | Status: DC

## 2018-07-11 MED ORDER — OMEPRAZOLE 20 MG PO CPDR: 20.0000 mg | DELAYED_RELEASE_CAPSULE | Freq: Every day | ORAL | 0 refills | Status: DC

## 2018-07-11 NOTE — Patient Instructions (Signed)
Please let me know how you are doing in 3 weeks

## 2018-07-11 NOTE — Progress Notes (Signed)
Subjective:    Patient ID: Darius Lopez, male    DOB: 1961/02/03, 57 y.o.   MRN: [id number]  HPI  57 year old male who  has a past medical history of Complication of anesthesia, Essential hypertension, and Hyperlipidemia. He presents to the office today for an acute complaint of abdominal discomfort. Pain is located in epigastric area and is present more so after eating. He denies nausea, vomiting, or diarrhea. He has woken up in the morning this week with a sour taste in his mouth. He reports that he was working on his diet and had been doing well for two months but over the last month his diet has suffered.   He also reports that he feels as though his anxiety has been worse lately. His wife has started to notice and he was advised to bring this up with me. He endorses that work and family stressors are the cause of his anxiety, he works for [employer] at [employer] and feels as though " the [employer] of the world is on me". He denies depression or SI. With his anxiety he has trouble concentrating on tasks and often has trouble turning his mind off at night. He has been on Zoloft in the past and is wondering if he can go back on this medication   Review of Systems See HPI   Past Medical History:  Diagnosis Date  . Complication of anesthesia    "Hard to intubate"per pt/per anesthesiologist in [place] over 20 years ago!  . Essential hypertension   . Hyperlipidemia     Social History   Socioeconomic History  . Marital status: Married    Spouse name: Not on file  . Number of children: Not on file  . Years of education: Not on file  . Highest education level: Not on file  Occupational History  . Occupation: Associate Professor: [employer]  Social Needs  . Financial resource strain: Not on file  . Food insecurity:    Worry: Not on file    Inability: Not on file  . Transportation needs:    Medical: Not on file    Non-medical: Not on file  Tobacco Use  . Smoking  status: Never Smoker  . Smokeless tobacco: Never Used  Substance and Sexual Activity  . Alcohol use: Yes    Comment: occasional  . Drug use: No  . Sexual activity: Not on file  Lifestyle  . Physical activity:    Days per week: Not on file    Minutes per session: Not on file  . Stress: Not on file  Relationships  . Social connections:    Talks on phone: Not on file    Gets together: Not on file    Attends religious service: Not on file    Active member of club or organization: Not on file    Attends meetings of clubs or organizations: Not on file    Relationship status: Not on file  . Intimate partner violence:    Fear of current or ex partner: Not on file    Emotionally abused: Not on file    Physically abused: Not on file    Forced sexual activity: Not on file  Other Topics Concern  . Not on file  Social History Narrative   Married   Has three children ( 30, 76. 11)    Likes to [place] and do BJJ    Past Surgical History:  Procedure Laterality Date  .  COLONOSCOPY N/A 02/03/2018   Procedure: COLONOSCOPY;  Surgeon: Sherrilyn Ristanis, Henry L III, MD;  Location: [facility];  Service: Gastroenterology;  Laterality: N/A;  . VASECTOMY     and then vasectomy reversal in 2001  . VASECTOMY REVERSAL      Family History  Problem Relation Age of Onset  . Heart disease Mother   . Mitral valve prolapse Mother   . Prostate cancer Maternal Grandfather   . Colon cancer Neg Hx   . Colon polyps Neg Hx   . Esophageal cancer Neg Hx   . Rectal cancer Neg Hx   . Stomach cancer Neg Hx   . Pancreatic cancer Neg Hx     No Known Allergies  Current Outpatient Medications on File Prior to Visit  Medication Sig Dispense Refill  . atorvastatin (LIPITOR) 20 MG tablet Take 1 tablet (20 mg total) by mouth daily. 90 tablet 3  . lisinopril (PRINIVIL,ZESTRIL) 10 MG tablet Take 1 tablet (10 mg total) by mouth daily. 90 tablet 3  . Omega-3 Fatty Acids (FISH OIL) 1200 MG CAPS Take by mouth daily. Take 1  pills daily     No current facility-administered medications on file prior to visit.     BP 120/70   Temp 98.5 F (36.9 C)   Wt 179 lb (81.2 kg)   BMI 24.97 kg/m       Objective:   Physical Exam  Constitutional: He is oriented to person, place, and time. He appears well-developed and well-nourished. No distress.  Cardiovascular: Normal rate, regular rhythm, normal heart sounds and intact distal pulses.  Pulmonary/Chest: Effort normal and breath sounds normal.  Abdominal: Soft. Normal appearance and bowel sounds are normal. He exhibits no mass. There is no hepatosplenomegaly, splenomegaly or hepatomegaly. There is tenderness in the epigastric area. There is no rigidity, no rebound, no guarding, no CVA tenderness, no tenderness at McBurney's point and negative Murphy's sign. No hernia.  Neurological: He is alert and oriented to person, place, and time.  Skin: Skin is warm and dry. He is not diaphoretic.  Psychiatric: He has a normal mood and affect. His behavior is normal. Judgment and thought content normal.  Nursing note and vitals reviewed.     Assessment & Plan:  1. Gastroesophageal reflux disease with esophagitis - exam consistent with GERD. Will have him work on cleaning up his diet and take Prilosec daily for 30 days.  - omeprazole (PRILOSEC) 20 MG capsule; Take 1 capsule (20 mg total) by mouth daily.  Dispense: 30 capsule; Refill: 0  2. Anxiety - sertraline (ZOLOFT) 25 MG tablet; Take 1 tablet (25 mg total) by mouth daily.  Dispense: 90 tablet; Refill: 1 - Follow up in 3 weeks or sooner if needed  Shirline Freesory Consuelo Thayne, NP

## 2018-08-02 ENCOUNTER — Other Ambulatory Visit: Payer: Self-pay | Admitting: Adult Health

## 2018-08-02 DIAGNOSIS — K2100 Gastro-esophageal reflux disease with esophagitis, without bleeding: Secondary | ICD-10-CM

## 2018-08-03 NOTE — Telephone Encounter (Signed)
Left a message for the pt.  Informed him he is due on 09/10/17 or after for CPX and lab work.  Instructed the pt to call back.  Rx sent to the pharmacy for 90 days. Nothing further needed at this time.

## 2018-09-13 ENCOUNTER — Other Ambulatory Visit: Payer: Self-pay | Admitting: Adult Health

## 2018-09-13 DIAGNOSIS — I10 Essential (primary) hypertension: Secondary | ICD-10-CM

## 2018-09-14 NOTE — Telephone Encounter (Signed)
Sent to the pharmacy by e-scribe.  Pt now scheduled for 09/29/2018.

## 2018-09-14 NOTE — Telephone Encounter (Signed)
Pt due for cpx and fasting lab work.  Message left for a call back.  CRM created.

## 2018-09-22 ENCOUNTER — Ambulatory Visit: Payer: Managed Care, Other (non HMO) | Admitting: Adult Health

## 2018-09-29 ENCOUNTER — Encounter: Payer: Managed Care, Other (non HMO) | Admitting: Adult Health

## 2018-10-19 ENCOUNTER — Encounter: Payer: Managed Care, Other (non HMO) | Admitting: Adult Health

## 2018-10-23 ENCOUNTER — Other Ambulatory Visit: Payer: Self-pay | Admitting: Adult Health

## 2018-10-23 DIAGNOSIS — F419 Anxiety disorder, unspecified: Secondary | ICD-10-CM

## 2018-10-25 DIAGNOSIS — F419 Anxiety disorder, unspecified: Secondary | ICD-10-CM | POA: Insufficient documentation

## 2018-10-25 NOTE — Telephone Encounter (Signed)
Filled on 07/11/2018 for 6 months.  Request denied.

## 2018-10-27 ENCOUNTER — Encounter: Payer: Self-pay | Admitting: Adult Health

## 2018-10-27 ENCOUNTER — Ambulatory Visit (INDEPENDENT_AMBULATORY_CARE_PROVIDER_SITE_OTHER): Payer: Managed Care, Other (non HMO) | Admitting: Adult Health

## 2018-10-27 ENCOUNTER — Other Ambulatory Visit: Payer: Self-pay

## 2018-10-27 VITALS — BP 118/82 | Temp 98.4°F | Ht 70.0 in | Wt 179.0 lb

## 2018-10-27 DIAGNOSIS — Z0000 Encounter for general adult medical examination without abnormal findings: Secondary | ICD-10-CM

## 2018-10-27 DIAGNOSIS — I10 Essential (primary) hypertension: Secondary | ICD-10-CM | POA: Diagnosis not present

## 2018-10-27 DIAGNOSIS — Z125 Encounter for screening for malignant neoplasm of prostate: Secondary | ICD-10-CM

## 2018-10-27 DIAGNOSIS — E782 Mixed hyperlipidemia: Secondary | ICD-10-CM

## 2018-10-27 DIAGNOSIS — F419 Anxiety disorder, unspecified: Secondary | ICD-10-CM

## 2018-10-27 LAB — CBC WITH DIFFERENTIAL/PLATELET
BASOS ABS: 0.1 10*3/uL (ref 0.0–0.1)
Basophils Relative: 1.2 % (ref 0.0–3.0)
EOS ABS: 0.2 10*3/uL (ref 0.0–0.7)
Eosinophils Relative: 3.7 % (ref 0.0–5.0)
HCT: 44.5 % (ref 39.0–52.0)
Hemoglobin: 15.4 g/dL (ref 13.0–17.0)
Lymphocytes Relative: 25.3 % (ref 12.0–46.0)
Lymphs Abs: 1.4 10*3/uL (ref 0.7–4.0)
MCHC: 34.6 g/dL (ref 30.0–36.0)
MCV: 87.9 fl (ref 78.0–100.0)
MONO ABS: 0.5 10*3/uL (ref 0.1–1.0)
Monocytes Relative: 9.5 % (ref 3.0–12.0)
NEUTROS ABS: 3.3 10*3/uL (ref 1.4–7.7)
Neutrophils Relative %: 60.3 % (ref 43.0–77.0)
PLATELETS: 211 10*3/uL (ref 150.0–400.0)
RBC: 5.06 Mil/uL (ref 4.22–5.81)
RDW: 12.8 % (ref 11.5–15.5)
WBC: 5.5 10*3/uL (ref 4.0–10.5)

## 2018-10-27 LAB — COMPREHENSIVE METABOLIC PANEL
ALT: 21 U/L (ref 0–53)
AST: 15 U/L (ref 0–37)
Albumin: 4.9 g/dL (ref 3.5–5.2)
Alkaline Phosphatase: 49 U/L (ref 39–117)
BILIRUBIN TOTAL: 1.7 mg/dL — AB (ref 0.2–1.2)
BUN: 19 mg/dL (ref 6–23)
CHLORIDE: 104 meq/L (ref 96–112)
CO2: 30 meq/L (ref 19–32)
Calcium: 9.7 mg/dL (ref 8.4–10.5)
Creatinine, Ser: 1.07 mg/dL (ref 0.40–1.50)
GFR: 71.46 mL/min (ref >60.00)
GLUCOSE: 112 mg/dL — AB (ref 70–99)
Potassium: 4.2 mEq/L (ref 3.5–5.1)
Sodium: 141 mEq/L (ref 135–145)
Total Protein: 7.2 g/dL (ref 6.0–8.3)

## 2018-10-27 LAB — LIPID PANEL
CHOL/HDL RATIO: 5
Cholesterol: 184 mg/dL (ref 0–200)
HDL: 35.6 mg/dL — AB (ref >39.00)
LDL Cholesterol: 115 mg/dL — ABNORMAL HIGH (ref 0–99)
NONHDL: 148.31
Triglycerides: 166 mg/dL — ABNORMAL HIGH (ref 0.0–149.0)
VLDL: 33.2 mg/dL (ref 0.0–40.0)

## 2018-10-27 LAB — PSA: PSA: 0.68 ng/mL (ref 0.10–4.00)

## 2018-10-27 LAB — TSH: TSH: 0.95 u[IU]/mL (ref 0.35–4.50)

## 2018-10-27 MED ORDER — SERTRALINE HCL 25 MG PO TABS: 25.0000 mg | ORAL_TABLET | Freq: Every day | ORAL | 1 refills | Status: DC

## 2018-10-27 NOTE — Progress Notes (Signed)
Subjective:    Patient ID: Darius Lopez, male    DOB: 1961/01/11, 58 y.o.   MRN: [id number]  HPI Patient presents for yearly preventative medicine examination. Pleasant 58 year old male who  has a past medical history of Complication of anesthesia, Essential hypertension, and Hyperlipidemia.  Essential Hypertension - Prescribed lisinopril 10 mg. He has not taken this medication in about a week. Denies headaches, blurred vision, or dizziness.  BP Readings from Last 3 Encounters:  10/27/18 118/82  07/11/18 120/70  05/12/18 118/80   Hyperlipidemia -prescribed Lipitor 20 mg tablets and omega-3.  Denies any myalgia Lab Results  Component Value Date   CHOL 261 (H) 09/26/2017   HDL 28.30 (L) 09/26/2017   LDLDIRECT 105.0 09/26/2017   TRIG (H) 09/26/2017    542.0 Triglyceride is over 400; calculations on Lipids are invalid.   CHOLHDL 9 09/26/2017    Anxiety -started on Zoloft 25 mg in November 2019 for anxiety stemming from work and family stressors.  Today in the office he reports that since starting Zoloft his anxiety and mood has improved tremendously. He feels as though this is the right dose for him. Denies side effects   All immunizations and health maintenance protocols were reviewed with the patient and needed orders were placed. UTD  Appropriate screening laboratory values were ordered for the patient including screening of hyperlipidemia, renal function and hepatic function. If indicated by BPH, a PSA was ordered.  Medication reconciliation,  past medical history, social history, problem list and allergies were reviewed in detail with the patient  Goals were established with regard to weight loss, exercise, and  diet in compliance with medications Wt Readings from Last 3 Encounters:  10/27/18 179 lb (81.2 kg)  07/11/18 179 lb (81.2 kg)  05/12/18 178 lb 3.2 oz (80.8 kg)    He is up-to-date on routine screening colonoscopy, dental and vision exams.  Denies any acute issues    Review of Systems  Constitutional: Negative.   HENT: Negative.   Eyes: Negative.   Respiratory: Negative.   Cardiovascular: Negative.   Gastrointestinal: Negative.   Endocrine: Negative.   Genitourinary: Negative.   Musculoskeletal: Negative.   Skin: Negative.   Allergic/Immunologic: Negative.   Neurological: Negative.   Hematological: Negative.   Psychiatric/Behavioral: Negative.   All other systems reviewed and are negative.  Past Medical History:  Diagnosis Date  . Complication of anesthesia    "Hard to intubate"per pt/per anesthesiologist in [place] over 20 years ago!  . Essential hypertension   . Hyperlipidemia     Social History   Socioeconomic History  . Marital status: Married    Spouse name: Not on file  . Number of children: Not on file  . Years of education: Not on file  . Highest education level: Not on file  Occupational History  . Occupation: Associate Professor: [employer]  Social Needs  . Financial resource strain: Not on file  . Food insecurity:    Worry: Not on file    Inability: Not on file  . Transportation needs:    Medical: Not on file    Non-medical: Not on file  Tobacco Use  . Smoking status: Never Smoker  . Smokeless tobacco: Never Used  Substance and Sexual Activity  . Alcohol use: Yes    Comment: occasional  . Drug use: No  . Sexual activity: Not on file  Lifestyle  . Physical activity:    Days per week: Not on file  Minutes per session: Not on file  . Stress: Not on file  Relationships  . Social connections:    Talks on phone: Not on file    Gets together: Not on file    Attends religious service: Not on file    Active member of club or organization: Not on file    Attends meetings of clubs or organizations: Not on file    Relationship status: Not on file  . Intimate partner violence:    Fear of current or ex partner: Not on file    Emotionally abused: Not on file    Physically abused: Not on file    Forced  sexual activity: Not on file  Other Topics Concern  . Not on file  Social History Narrative   Married   Has three children ( 30, 70. 61)    Likes to [place] and do BJJ    Past Surgical History:  Procedure Laterality Date  . COLONOSCOPY N/A 02/03/2018   Procedure: COLONOSCOPY;  Surgeon: Sherrilyn Rist, MD;  Location: [facility];  Service: Gastroenterology;  Laterality: N/A;  . VASECTOMY     and then vasectomy reversal in 2001  . VASECTOMY REVERSAL      Family History  Problem Relation Age of Onset  . Heart disease Mother   . Mitral valve prolapse Mother   . Prostate cancer Maternal Grandfather   . Colon cancer Neg Hx   . Colon polyps Neg Hx   . Esophageal cancer Neg Hx   . Rectal cancer Neg Hx   . Stomach cancer Neg Hx   . Pancreatic cancer Neg Hx     No Known Allergies  Current Outpatient Medications on File Prior to Visit  Medication Sig Dispense Refill  . atorvastatin (LIPITOR) 20 MG tablet Take 1 tablet (20 mg total) by mouth daily. 90 tablet 3  . Omega-3 Fatty Acids (FISH OIL) 1200 MG CAPS Take by mouth daily. Take 1 pills daily     No current facility-administered medications on file prior to visit.     BP 118/82   Temp 98.4 F (36.9 C)   Ht 5\' 10"  (1.778 m)   Wt 179 lb (81.2 kg)   BMI 25.68 kg/m       Objective:   Physical Exam Vitals signs and nursing note reviewed.  Constitutional:      General: He is not in acute distress.    Appearance: Normal appearance. He is well-developed and normal weight. He is not diaphoretic.  HENT:     Head: Normocephalic and atraumatic.     Right Ear: Tympanic membrane, ear canal and external ear normal. There is no impacted cerumen.     Left Ear: Tympanic membrane and external ear normal. There is no impacted cerumen.     Nose: Nose normal. No congestion or rhinorrhea.     Mouth/Throat:     Mouth: Mucous membranes are moist.     Pharynx: Oropharynx is clear. No oropharyngeal exudate or posterior oropharyngeal  erythema.  Eyes:     General: No scleral icterus.       Right eye: No discharge.        Left eye: No discharge.     Conjunctiva/sclera: Conjunctivae normal.     Pupils: Pupils are equal, round, and reactive to light.  Neck:     Musculoskeletal: Normal range of motion and neck supple. No neck rigidity or muscular tenderness.     Thyroid: No thyromegaly.     Trachea:  No tracheal deviation.  Cardiovascular:     Rate and Rhythm: Normal rate and regular rhythm.     Heart sounds: Normal heart sounds. No murmur. No friction rub. No gallop.   Pulmonary:     Effort: Pulmonary effort is normal. No respiratory distress.     Breath sounds: Normal breath sounds. No stridor. No wheezing, rhonchi or rales.  Chest:     Chest wall: No tenderness.  Abdominal:     General: Bowel sounds are normal. There is no distension.     Palpations: Abdomen is soft. There is no mass.     Tenderness: There is no abdominal tenderness. There is no guarding or rebound.     Hernia: No hernia is present.  Musculoskeletal: Normal range of motion.        General: No swelling, tenderness, deformity or signs of injury.     Right lower leg: No edema.     Left lower leg: No edema.  Lymphadenopathy:     Cervical: No cervical adenopathy.  Skin:    General: Skin is warm and dry.     Coloration: Skin is not jaundiced or pale.     Findings: No bruising, erythema, lesion or rash.  Neurological:     General: No focal deficit present.     Mental Status: He is alert and oriented to person, place, and time. Mental status is at baseline.     Cranial Nerves: No cranial nerve deficit.     Sensory: No sensory deficit.     Motor: No weakness.     Coordination: Coordination normal.     Gait: Gait normal.     Deep Tendon Reflexes: Reflexes normal.  Psychiatric:        Mood and Affect: Mood normal.        Behavior: Behavior normal.        Thought Content: Thought content normal.        Judgment: Judgment normal.        Assessment & Plan:  1. Routine general medical examination at a health care facility - Work on lifestyle modifications  - Follow up in one year or sooner if needed - CBC with Differential/Platelet - Comprehensive metabolic panel - Lipid panel - TSH  2. Anxiety  - sertraline (ZOLOFT) 25 MG tablet; Take 1 tablet (25 mg total) by mouth daily.  Dispense: 90 tablet; Refill: 1  3. Essential hypertension - Will d/c lisinopril. BP well controlled without medication. Possibly elevated bP in the past due to anxiety  - CBC with Differential/Platelet - Comprehensive metabolic panel - Lipid panel - TSH  4. Mixed hyperlipidemia - Consider increase in statin  - CBC with Differential/Platelet - Comprehensive metabolic panel - Lipid panel - TSH  5. Prostate cancer screening  - PSA  Shirline Frees, NP

## 2018-10-30 ENCOUNTER — Other Ambulatory Visit: Payer: Self-pay | Admitting: Adult Health

## 2018-10-30 NOTE — Telephone Encounter (Signed)
Sent to the pharmacy by e-scribe. 

## 2018-10-31 ENCOUNTER — Telehealth: Payer: Self-pay | Admitting: Adult Health

## 2018-10-31 NOTE — Telephone Encounter (Signed)
Patient returning call for results. Nurse triage currently unavailable. Please advise.    Copied from CRM [id number]. Topic: Quick Communication - Lab Results (Clinic Use ONLY) >> Oct 31, 2018  5:12 PM Raj Janus T, [place] wrote: Called patient to inform them of all lab results from 10/27/2018. When patient returns call, triage nurse may disclose results.

## 2018-10-31 NOTE — Telephone Encounter (Signed)
Lab results given to patient.

## 2018-12-14 ENCOUNTER — Other Ambulatory Visit: Payer: Self-pay | Admitting: Adult Health

## 2018-12-14 DIAGNOSIS — I10 Essential (primary) hypertension: Secondary | ICD-10-CM

## 2018-12-15 NOTE — Telephone Encounter (Signed)
Lisinopril has been discontinued.  Denied.  Message sent to the pharmacy.

## 2019-02-05 ENCOUNTER — Ambulatory Visit: Payer: Self-pay

## 2019-02-05 NOTE — Telephone Encounter (Signed)
Pt called stating that he was exposed at work to COVID-19 positive patient. He states that he had traveled to [place] for work. He states that they ate lunch together without mask but all other times were wearing mask. He has no symptoms to report but is requesting testing. Last exposure to the co-worker was Thursday. Care advice read to patient. Patient verbalized understanding. Call transferred to office for scheduling.  Reason for Disposition . [1] COVID-19 EXPOSURE (Close Contact) AND [2] within last 14 days BUT [3] NO symptoms  Answer Assessment - Initial Assessment Questions 1. CLOSE CONTACT: "Who is the person with the confirmed or suspected COVID-19 infection that you were exposed to?"     Co worker 2. PLACE of CONTACT: "Where were you when you were exposed to COVID-19?" (e.g., home, school, medical waiting room; which city?)     work 3. TYPE of CONTACT: "How much contact was there?" (e.g., sitting next to, live in same house, work in same office, same building)     Traveling with person office and lunch 4. DURATION of CONTACT: "How long were you in contact with the COVID-19 patient?" (e.g., a few seconds, passed by person, a few minutes, live with the patient)     Several  hour day 5. DATE of CONTACT: "When did you have contact with a COVID-19 patient?" (e.g., how many days ago)     Thursday 6. TRAVEL: "Have you traveled out of the country recently?" If so, "When and where?"     * Also ask about out-of-state travel, since the CDC has identified some high-risk cities for community spread in the [place].     * Note: Travel becomes less relevant if there is widespread community transmission where the patient lives.    [place] 7. COMMUNITY SPREAD: "Are there lots of cases of COVID-19 (community spread) where you live?" (See public health department website, if unsure)       unsure 8. SYMPTOMS: "Do you have any symptoms?" (e.g., fever, cough, breathing difficulty)    no 9. PREGNANCY OR  POSTPARTUM: "Is there any chance you are pregnant?" "When was your last menstrual period?" "Did you deliver in the last 2 weeks?"    N/A 10. HIGH RISK: "Do you have any heart or lung problems? Do you have a weak immune system?" (e.g., CHF, COPD, asthma, HIV positive, chemotherapy, renal failure, diabetes mellitus, sickle cell anemia)     no  Protocols used: CORONAVIRUS (COVID-19) EXPOSURE-A-AH

## 2019-02-05 NOTE — Telephone Encounter (Signed)
Appt scheduled with PCP 

## 2019-02-06 ENCOUNTER — Encounter: Payer: Self-pay | Admitting: Adult Health

## 2019-02-06 ENCOUNTER — Telehealth: Payer: Self-pay | Admitting: *Deleted

## 2019-02-06 ENCOUNTER — Other Ambulatory Visit: Payer: Self-pay

## 2019-02-06 ENCOUNTER — Ambulatory Visit (INDEPENDENT_AMBULATORY_CARE_PROVIDER_SITE_OTHER): Payer: Managed Care, Other (non HMO) | Admitting: Adult Health

## 2019-02-06 DIAGNOSIS — Z20822 Contact with and (suspected) exposure to covid-19: Secondary | ICD-10-CM

## 2019-02-06 DIAGNOSIS — Z20828 Contact with and (suspected) exposure to other viral communicable diseases: Secondary | ICD-10-CM | POA: Diagnosis not present

## 2019-02-06 NOTE — Telephone Encounter (Signed)
Patient scheduled for covid-19 testing today @ [facility] @ 2:00. Instructions given and order placed

## 2019-02-06 NOTE — Telephone Encounter (Signed)
LM on pt's VM to call [phone] M-F 7a-7p to schedule covid-19 testing. Order placed

## 2019-02-06 NOTE — Progress Notes (Signed)
Virtual Visit via Video Note  I connected with Darius Lopez on 02/06/19 at 10:00 AM EDT by a video enabled telemedicine application and verified that I am speaking with the correct person using two identifiers.  Location patient: home Location provider:work or home office Persons participating in the virtual visit: patient, provider  I discussed the limitations of evaluation and management by telemedicine and the availability of in person appointments. The patient expressed understanding and agreed to proceed.   HPI: 58 year old male who was exposed to [place] last Monday through Thursday a coworker.  Coworker was tested late last week and just found out the results.  He denies any signs or symptoms.  Has been quarantining at home since he found out that his coworker tested positive.   ROS: See pertinent positives and negatives per HPI.  Past Medical History:  Diagnosis Date  . Complication of anesthesia    "Hard to intubate"per pt/per anesthesiologist in [place] over 20 years ago!  . Essential hypertension   . Hyperlipidemia     Past Surgical History:  Procedure Laterality Date  . COLONOSCOPY N/A 02/03/2018   Procedure: COLONOSCOPY;  Surgeon: Doran Stabler, MD;  Location: [facility];  Service: Gastroenterology;  Laterality: N/A;  . VASECTOMY     and then vasectomy reversal in 2001  . VASECTOMY REVERSAL      Family History  Problem Relation Age of Onset  . Heart disease Mother   . Mitral valve prolapse Mother   . Prostate cancer Maternal Grandfather   . Colon cancer Neg Hx   . Colon polyps Neg Hx   . Esophageal cancer Neg Hx   . Rectal cancer Neg Hx   . Stomach cancer Neg Hx   . Pancreatic cancer Neg Hx      Current Outpatient Medications:  .  atorvastatin (LIPITOR) 20 MG tablet, TAKE 1 TABLET BY MOUTH EVERY DAY, Disp: 90 tablet, Rfl: 3 .  Omega-3 Fatty Acids (FISH OIL) 1200 MG CAPS, Take by mouth daily. Take 1 pills daily, Disp: , Rfl:  .  sertraline (ZOLOFT) 25  MG tablet, Take 1 tablet (25 mg total) by mouth daily., Disp: 90 tablet, Rfl: 1  EXAM:  VITALS per patient if applicable:  GENERAL: alert, oriented, appears well and in no acute distress  HEENT: atraumatic, conjunttiva clear, no obvious abnormalities on inspection of external nose and ears  NECK: normal movements of the head and neck  LUNGS: on inspection no signs of respiratory distress, breathing rate appears normal, no obvious gross SOB, gasping or wheezing  CV: no obvious cyanosis  MS: moves all visible extremities without noticeable abnormality  PSYCH/NEURO: pleasant and cooperative, no obvious depression or anxiety, speech and thought processing grossly intact  ASSESSMENT AND PLAN:  Discussed the following assessment and plan:  1. Exposure to Covid-19 Virus -Send referral to for COVID testing at [facility].  He was advised to continue to self quarantine until his test results come back    I discussed the assessment and treatment plan with the patient. The patient was provided an opportunity to ask questions and all were answered. The patient agreed with the plan and demonstrated an understanding of the instructions.   The patient was advised to call back or seek an in-person evaluation if the symptoms worsen or if the condition fails to improve as anticipated.   Dorothyann Peng, NP

## 2019-02-06 NOTE — Telephone Encounter (Signed)
-----   Message from [name], [place] sent at 02/06/2019 10:51 AM EDT ----- Regarding: Covid-19 Darius Lopez, AGNP-C requesting this pt have COVID testing.

## 2019-02-09 ENCOUNTER — Encounter: Payer: Self-pay | Admitting: Adult Health

## 2019-02-10 LAB — NOVEL CORONAVIRUS, NAA: SARS-CoV-2, NAA: NOT DETECTED

## 2019-03-27 ENCOUNTER — Encounter: Payer: Self-pay | Admitting: Adult Health

## 2019-03-27 ENCOUNTER — Ambulatory Visit: Payer: Managed Care, Other (non HMO) | Admitting: Adult Health

## 2019-03-27 ENCOUNTER — Other Ambulatory Visit: Payer: Self-pay

## 2019-03-27 VITALS — BP 140/90 | Temp 98.7°F | Wt 184.0 lb

## 2019-03-27 DIAGNOSIS — F419 Anxiety disorder, unspecified: Secondary | ICD-10-CM

## 2019-03-27 DIAGNOSIS — M25511 Pain in right shoulder: Secondary | ICD-10-CM | POA: Diagnosis not present

## 2019-03-27 MED ORDER — SERTRALINE HCL 50 MG PO TABS: 50.0000 mg | ORAL_TABLET | Freq: Every day | ORAL | 1 refills | Status: DC

## 2019-03-27 MED ORDER — METHYLPREDNISOLONE ACETATE 80 MG/ML IJ SUSP
80.0000 mg | Freq: Once | INTRAMUSCULAR | Status: AC
  Administered 2019-03-27: 80 mg via INTRA_ARTICULAR

## 2019-03-27 NOTE — Progress Notes (Signed)
Subjective:    Patient ID: Darius Lopez, male    DOB: 12/17/1960, 58 y.o.   MRN: [id number]  HPI  58 year old male who  has a past medical history of Complication of anesthesia, Essential hypertension, and Hyperlipidemia.  He presents to the office for right sided shoulder pain x 4 days. Pain has been becoming worse as the week goes on. Pain is worse with movement and he does have some loss of ROM.  He denies trauma to the area but woke up with the pain and believes he slept on it wrong. Denies numbness or tingling in his right arm or fingers.   Same type of pain he had in September at which time we had given him a steroid injection and his pain resolved.  He also reports increasing anxiety over the last 2 weeks.  Currently prescribed Zoloft 5 mg.  Anxiety is coming from work stress.  He is wondering if he can go up on his Zoloft better control of anxiety   Review of Systems See HPI   Past Medical History:  Diagnosis Date  . Complication of anesthesia    "Hard to intubate"per pt/[place]ologist in [place] over 20 years ago!  . Essential hypertension   . Hyperlipidemia     Social History   Socioeconomic History  . Marital status: Married    Spouse name: Not on file  . Number of children: Not on file  . Years of education: Not on file  . Highest education level: Not on file  Occupational History  . Occupation: Associate ProfessorCorporate    Employer: [employer]  Social Needs  . Financial resource strain: Not on file  . Food insecurity    Worry: Not on file    Inability: Not on file  . Transportation needs    Medical: Not on file    Non-medical: Not on file  Tobacco Use  . Smoking status: Never Smoker  . Smokeless tobacco: Never Used  Substance and Sexual Activity  . Alcohol use: Yes    Comment: occasional  . Drug use: No  . Sexual activity: Not on file  Lifestyle  . Physical activity    Days per week: Not on file    Minutes per session: Not on file  . Stress: Not on  file  Relationships  . Social Musicianconnections    Talks on phone: Not on file    Gets together: Not on file    Attends religious service: Not on file    Active member of club or organization: Not on file    Attends meetings of clubs or organizations: Not on file    Relationship status: Not on file  . Intimate partner violence    Fear of current or ex partner: Not on file    Emotionally abused: Not on file    Physically abused: Not on file    Forced sexual activity: Not on file  Other Topics Concern  . Not on file  Social History Narrative   Married   Has three children ( 30, 2527. 3111)    [place] and do BJJ    Past Surgical History:  Procedure Laterality Date  . COLONOSCOPY N/A 02/03/2018   Procedure: COLONOSCOPY;  Surgeon: Sherrilyn Ristanis, Henry L III, MD;  [facility];  Service: Gastroenterology;  Laterality: N/A;  . VASECTOMY     and then vasectomy reversal in 2001  . VASECTOMY REVERSAL      Family History  Problem Relation  Age of Onset  . Heart disease Mother   . Mitral valve prolapse Mother   . Prostate cancer Maternal Grandfather   . Colon cancer Neg Hx   . Colon polyps Neg Hx   . Esophageal cancer Neg Hx   . Rectal cancer Neg Hx   . Stomach cancer Neg Hx   . Pancreatic cancer Neg Hx     No Known Allergies  Current Outpatient Medications on File Prior to Visit  Medication Sig Dispense Refill  . atorvastatin (LIPITOR) 20 MG tablet TAKE 1 TABLET BY MOUTH EVERY DAY 90 tablet 3  . Omega-3 Fatty Acids (FISH OIL) 1200 MG CAPS Take by mouth daily. Take 1 pills daily     No current facility-administered medications on file prior to visit.     BP 140/90   Temp 98.7 F (37.1 C)   Wt 184 lb (83.5 kg)   BMI 26.40 kg/m       Objective:   Physical Exam Vitals signs and nursing note reviewed.  Constitutional:      Appearance: Normal appearance.  Cardiovascular:     Rate and Rhythm: Normal rate and regular rhythm.     Pulses: Normal pulses.     Heart  sounds: Normal heart sounds.  Pulmonary:     Effort: Pulmonary effort is normal.     Breath sounds: Normal breath sounds.  Musculoskeletal:        General: Tenderness present.     Right shoulder: He exhibits decreased range of motion, tenderness, bony tenderness and swelling. He exhibits no crepitus, no pain, normal pulse and normal strength.  Skin:    General: Skin is warm and dry.     Capillary Refill: Capillary refill takes less than 2 seconds.  Neurological:     General: No focal deficit present.     Mental Status: He is alert.  Psychiatric:        Mood and Affect: Mood normal.        Behavior: Behavior normal.        Thought Content: Thought content normal.        Judgment: Judgment normal.       Assessment & Plan:  1. Acute pain of right shoulder Discussed risks and benefits of corticosteroid injection and patient consented.  After prepping skin with betadine, injected 80 mg depomedrol and 2 cc of plain xylocaine with 22 gauge one and one half inch needle using anterolateral approach and pt tolerated well. - methylPREDNISolone acetate (DEPO-MEDROL) injection 80 mg  2. Anxiety - Will increase Zoloft to 50 mg. Follow up in one month if not controlled.  - sertraline (ZOLOFT) 50 MG tablet; Take 1 tablet (50 mg total) by mouth daily.  Dispense: 90 tablet; Refill: 1   Dorothyann Peng, NP

## 2019-06-05 ENCOUNTER — Ambulatory Visit (INDEPENDENT_AMBULATORY_CARE_PROVIDER_SITE_OTHER): Payer: Managed Care, Other (non HMO)

## 2019-06-05 ENCOUNTER — Other Ambulatory Visit: Payer: Self-pay

## 2019-06-05 ENCOUNTER — Ambulatory Visit: Payer: Managed Care, Other (non HMO) | Admitting: Adult Health

## 2019-06-05 ENCOUNTER — Other Ambulatory Visit: Payer: Self-pay | Admitting: Adult Health

## 2019-06-05 ENCOUNTER — Encounter: Payer: Self-pay | Admitting: Adult Health

## 2019-06-05 VITALS — BP 140/80 | HR 90 | Temp 98.2°F | Wt 188.2 lb

## 2019-06-05 DIAGNOSIS — M79602 Pain in left arm: Secondary | ICD-10-CM | POA: Diagnosis not present

## 2019-06-05 DIAGNOSIS — M25512 Pain in left shoulder: Secondary | ICD-10-CM

## 2019-06-05 MED ORDER — BACLOFEN 10 MG PO TABS: 10.0000 mg | ORAL_TABLET | Freq: Three times a day (TID) | ORAL | 0 refills | Status: DC

## 2019-06-05 MED ORDER — PREDNISONE 10 MG PO TABS
ORAL_TABLET | ORAL | 0 refills | Status: DC
Start: 2019-06-05 — End: 2020-03-13

## 2019-06-05 MED ORDER — KETOROLAC TROMETHAMINE 60 MG/2ML IM SOLN
60.0000 mg | Freq: Once | INTRAMUSCULAR | Status: AC
  Administered 2019-06-05: 16:00:00 60 mg via INTRAMUSCULAR

## 2019-06-05 NOTE — Progress Notes (Signed)
Subjective:    Patient ID: Darius Lopez, male    DOB: 10/10/1960, 58 y.o.   MRN: [id number]  HPI 58 year old male who  has a past medical history of Complication of anesthesia, Essential hypertension, and Hyperlipidemia.  He presents to the clinic today for shoulder pain.  He reports that 3 days ago he received his flu shot and then went home to work in the yard, last night his left shoulder started to ache and this morning when he woke up he noticed significant pain and is described as an aching and loss of range of motion.  Reports that his wife had to help him get his shirt on because he could not raise his arm above his head without being in excruciating pain.  As today has gone on the pain has not been worse but it has also not dissipated as well.  He still has significant loss of range of motion.  He has a history of shoulder bursitis and reports that this pain does not feel like that.  Pain is located along the front of the left shoulder as well as to the left bicep.  He has no radiating pain down his left arm or up his left jaw, no chest pain, no shortness of breath   Review of Systems See HPI   Past Medical History:  Diagnosis Date  . Complication of anesthesia    "Hard to intubate"per pt/per anesthesiologist in [place] over 20 years ago!  . Essential hypertension   . Hyperlipidemia     Social History   Socioeconomic History  . Marital status: Married    Spouse name: Not on file  . Number of children: Not on file  . Years of education: Not on file  . Highest education level: Not on file  Occupational History  . Occupation: Social research officer, government: [employer]  Social Needs  . Financial resource strain: Not on file  . Food insecurity    Worry: Not on file    Inability: Not on file  . Transportation needs    Medical: Not on file    Non-medical: Not on file  Tobacco Use  . Smoking status: Never Smoker  . Smokeless tobacco: Never Used  Substance and Sexual Activity   . Alcohol use: Yes    Comment: occasional  . Drug use: No  . Sexual activity: Not on file  Lifestyle  . Physical activity    Days per week: Not on file    Minutes per session: Not on file  . Stress: Not on file  Relationships  . Social Herbalist on phone: Not on file    Gets together: Not on file    Attends religious service: Not on file    Active member of club or organization: Not on file    Attends meetings of clubs or organizations: Not on file    Relationship status: Not on file  . Intimate partner violence    Fear of current or ex partner: Not on file    Emotionally abused: Not on file    Physically abused: Not on file    Forced sexual activity: Not on file  Other Topics Concern  . Not on file  Social History Narrative   Married   Has three children ( 22, 33. 90)    Likes to [place] and do [place]    Past Surgical History:  Procedure Laterality Date  . COLONOSCOPY N/A 02/03/2018  Procedure: COLONOSCOPY;  Surgeon: Sherrilyn Rist, MD;  Location: [facility];  Service: Gastroenterology;  Laterality: N/A;  . VASECTOMY     and then vasectomy reversal in 2001  . VASECTOMY REVERSAL      Family History  Problem Relation Age of Onset  . Heart disease Mother   . Mitral valve prolapse Mother   . Prostate cancer Maternal Grandfather   . Colon cancer Neg Hx   . Colon polyps Neg Hx   . Esophageal cancer Neg Hx   . Rectal cancer Neg Hx   . Stomach cancer Neg Hx   . Pancreatic cancer Neg Hx     No Known Allergies  Current Outpatient Medications on File Prior to Visit  Medication Sig Dispense Refill  . atorvastatin (LIPITOR) 20 MG tablet TAKE 1 TABLET BY MOUTH EVERY DAY 90 tablet 3  . Omega-3 Fatty Acids (FISH OIL) 1200 MG CAPS Take by mouth daily. Take 1 pills daily    . sertraline (ZOLOFT) 50 MG tablet Take 1 tablet (50 mg total) by mouth daily. 90 tablet 1   No current facility-administered medications on file prior to visit.     BP 140/80 (BP  Location: Right Arm, Patient Position: Sitting, Cuff Size: Normal)   Pulse 90   Temp 98.2 F (36.8 C) (Temporal)   Wt 188 lb 3.2 oz (85.4 kg)   SpO2 95%   BMI 27.00 kg/m       Objective:   Physical Exam Vitals signs and nursing note reviewed.  Constitutional:      Appearance: Normal appearance.  Cardiovascular:     Rate and Rhythm: Normal rate and regular rhythm.     Pulses: Normal pulses.     Heart sounds: Normal heart sounds.  Pulmonary:     Effort: Pulmonary effort is normal.     Breath sounds: Normal breath sounds.  Musculoskeletal:        General: Tenderness present. No swelling or deformity.     Left shoulder: He exhibits decreased range of motion, tenderness and pain. He exhibits no bony tenderness, no swelling, no effusion, no crepitus, no spasm, normal pulse and normal strength.     Comments: No deformities or soft tissue mass noted to the left shoulder or left bicep.  Unable to raise arm above head without significant discomfort he is able to do back scratch test but has pain with doing so  Skin:    General: Skin is warm and dry.     Capillary Refill: Capillary refill takes less than 2 seconds.  Neurological:     General: No focal deficit present.     Mental Status: He is alert and oriented to person, place, and time.  Psychiatric:        Mood and Affect: Mood normal.        Behavior: Behavior normal.        Thought Content: Thought content normal.        Judgment: Judgment normal.       Assessment & Plan:  1. Left arm pain -No concern for neurovascular issue.  No concern for rotator cuff injury.  No dislocation noted but will get x-ray to rule that out.  Appears to be more muscular in nature.  Will prescribe baclofen and prednisone.  If pain does not improve and will order MRI - baclofen (LIORESAL) 10 MG tablet; Take 1 tablet (10 mg total) by mouth 3 (three) times daily.  Dispense: 30 each; Refill: 0 - predniSONE (DELTASONE)  10 MG tablet; 40 mg x 3 days, 20 mg  x 3 days, 10 mg x 3 days  Dispense: 21 tablet; Refill: 0 - ketorolac (TORADOL) injection 60 mg - DG Shoulder Left; Future - DG Shoulder Left   Shirline Freesory Kree Armato, NP

## 2019-08-29 ENCOUNTER — Ambulatory Visit: Payer: Managed Care, Other (non HMO) | Attending: Internal Medicine

## 2019-08-29 ENCOUNTER — Other Ambulatory Visit: Payer: Self-pay

## 2019-08-29 DIAGNOSIS — Z20822 Contact with and (suspected) exposure to covid-19: Secondary | ICD-10-CM

## 2019-08-30 LAB — NOVEL CORONAVIRUS, NAA: SARS-CoV-2, NAA: NOT DETECTED

## 2019-12-20 ENCOUNTER — Other Ambulatory Visit: Payer: Self-pay | Admitting: Adult Health

## 2019-12-20 DIAGNOSIS — F419 Anxiety disorder, unspecified: Secondary | ICD-10-CM

## 2019-12-20 NOTE — Telephone Encounter (Signed)
30 day supply sent to the pharmacy.  Pt due for cpx. 

## 2019-12-23 ENCOUNTER — Other Ambulatory Visit: Payer: Self-pay | Admitting: Adult Health

## 2019-12-25 NOTE — Telephone Encounter (Signed)
SENT TO THE PHARMACY BY E-SCRIBE FOR 30 DAYS.   DUE FOR CPX.

## 2020-01-03 ENCOUNTER — Other Ambulatory Visit: Payer: Self-pay | Admitting: Adult Health

## 2020-01-03 DIAGNOSIS — F419 Anxiety disorder, unspecified: Secondary | ICD-10-CM

## 2020-01-08 ENCOUNTER — Other Ambulatory Visit: Payer: Self-pay | Admitting: Adult Health

## 2020-03-13 ENCOUNTER — Ambulatory Visit: Payer: Managed Care, Other (non HMO) | Admitting: Adult Health

## 2020-03-13 ENCOUNTER — Encounter: Payer: Self-pay | Admitting: Adult Health

## 2020-03-13 ENCOUNTER — Other Ambulatory Visit: Payer: Self-pay

## 2020-03-13 VITALS — BP 130/82 | Temp 98.2°F | Wt 194.0 lb

## 2020-03-13 DIAGNOSIS — T23171A Burn of first degree of right wrist, initial encounter: Secondary | ICD-10-CM | POA: Diagnosis not present

## 2020-03-13 DIAGNOSIS — F419 Anxiety disorder, unspecified: Secondary | ICD-10-CM

## 2020-03-13 MED ORDER — SERTRALINE HCL 50 MG PO TABS: 50.0000 mg | ORAL_TABLET | Freq: Every day | ORAL | 1 refills | Status: DC
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
## 2020-07-01 ENCOUNTER — Other Ambulatory Visit: Payer: Self-pay

## 2020-07-01 ENCOUNTER — Ambulatory Visit: Payer: Managed Care, Other (non HMO) | Admitting: Adult Health

## 2020-07-01 ENCOUNTER — Encounter: Payer: Self-pay | Admitting: Adult Health

## 2020-07-01 VITALS — BP 130/80 | HR 72 | Temp 99.3°F | Ht 70.0 in | Wt 190.2 lb

## 2020-07-01 DIAGNOSIS — M25512 Pain in left shoulder: Secondary | ICD-10-CM | POA: Diagnosis not present

## 2020-07-01 MED ORDER — ATORVASTATIN CALCIUM 20 MG PO TABS: ORAL_TABLET | ORAL | 3 refills | Status: DC

## 2020-07-01 MED ORDER — METHYLPREDNISOLONE ACETATE 80 MG/ML IJ SUSP
80.0000 mg | Freq: Once | INTRAMUSCULAR | Status: AC
  Administered 2020-07-01: 80 mg via INTRA_ARTICULAR

## 2020-07-01 NOTE — Progress Notes (Signed)
Subjective:    Patient ID: Darius Lopez, male    DOB: 09-Oct-1960, 59 y.o.   MRN: [id number]  HPI 59 year old male who  has a past medical history of Complication of anesthesia, Essential hypertension, and Hyperlipidemia.  He presents to the office today for an acute on chronic issue of left shoulder pain.  Pain has been present for 1 to 2 days.  Reports pain similar to what he has had in the past at which time he received a steroid injection and the pain resolved.  Not have any loss of range of motion, clicking, or tightness in the shoulder.  Denies any aggravating injury or trauma.  Feels as though the cold weather be contributing factor  Last steroid injection was in August 2020   Review of Systems See HPI   Past Medical History:  Diagnosis Date  . Complication of anesthesia    "Hard to intubate"per pt/per anesthesiologist in [place] over 20 years ago!  . Essential hypertension   . Hyperlipidemia     Social History   Socioeconomic History  . Marital status: Married    Spouse name: Not on file  . Number of children: Not on file  . Years of education: Not on file  . Highest education level: Not on file  Occupational History  . Occupation: Associate Professor: [employer]  Tobacco Use  . Smoking status: Never Smoker  . Smokeless tobacco: Never Used  Vaping Use  . Vaping Use: Never used  Substance and Sexual Activity  . Alcohol use: Yes    Comment: occasional  . Drug use: No  . Sexual activity: Not on file  Other Topics Concern  . Not on file  Social History Narrative   Married   Has three children ( 30, 45. 14)    Likes to [place] and do BJJ   Social Determinants of Corporate investment banker Strain:   . Difficulty of Paying Living Expenses: Not on file  Food Insecurity:   . Worried About Programme researcher, broadcasting/film/video in the Last Year: Not on file  . Ran Out of Food in the Last Year: Not on file  Transportation Needs:   . Lack of Transportation (Medical): Not on  file  . Lack of Transportation (Non-Medical): Not on file  Physical Activity:   . Days of Exercise per Week: Not on file  . Minutes of Exercise per Session: Not on file  Stress:   . Feeling of Stress : Not on file  Social Connections:   . Frequency of Communication with Friends and Family: Not on file  . Frequency of Social Gatherings with Friends and Family: Not on file  . Attends Religious Services: Not on file  . Active Member of Clubs or Organizations: Not on file  . Attends Banker Meetings: Not on file  . Marital Status: Not on file  Intimate Partner Violence:   . Fear of Current or Ex-Partner: Not on file  . Emotionally Abused: Not on file  . Physically Abused: Not on file  . Sexually Abused: Not on file    Past Surgical History:  Procedure Laterality Date  . COLONOSCOPY N/A 02/03/2018   Procedure: COLONOSCOPY;  Surgeon: Sherrilyn Rist, MD;  Location: [facility];  Service: Gastroenterology;  Laterality: N/A;  . VASECTOMY     and then vasectomy reversal in 2001  . VASECTOMY REVERSAL      Family History  Problem Relation Age of  Onset  . Heart disease Mother   . Mitral valve prolapse Mother   . Prostate cancer Maternal Grandfather   . Colon cancer Neg Hx   . Colon polyps Neg Hx   . Esophageal cancer Neg Hx   . Rectal cancer Neg Hx   . Stomach cancer Neg Hx   . Pancreatic cancer Neg Hx     No Known Allergies  Current Outpatient Medications on File Prior to Visit  Medication Sig Dispense Refill  . Omega-3 Fatty Acids (FISH OIL) 1200 MG CAPS Take by mouth daily. Take 1 pills daily    . sertraline (ZOLOFT) 50 MG tablet Take 1 tablet (50 mg total) by mouth daily. 90 tablet 1   No current facility-administered medications on file prior to visit.    BP 130/80 (BP Location: Right Arm, Patient Position: Sitting, Cuff Size: Normal)   Pulse 72   Temp 99.3 F (37.4 C) (Oral)   Ht 5\' 10"  (1.778 m)   Wt 190 lb 3.2 oz (86.3 kg)   BMI 27.29 kg/m        Objective:   Physical Exam Vitals and nursing note reviewed.  Constitutional:      Appearance: Normal appearance.  Cardiovascular:     Rate and Rhythm: Normal rate and regular rhythm.     Pulses: Normal pulses.     Heart sounds: Normal heart sounds.  Pulmonary:     Effort: Pulmonary effort is normal.     Breath sounds: Normal breath sounds.  Musculoskeletal:        General: Tenderness present. Normal range of motion.  Skin:    General: Skin is warm and dry.  Neurological:     General: No focal deficit present.     Mental Status: He is alert and oriented to person, place, and time.  Psychiatric:        Mood and Affect: Mood normal.        Behavior: Behavior normal.        Thought Content: Thought content normal.        Judgment: Judgment normal.       Assessment & Plan:  1. Acute pain of left shoulder -Likely due to osteoarthritis.  Discussed treatment options and he opted for Depo-Medrol injection since his responded well to this in the past Shoulder injection Verbal consent obtained and verified. Sterile betadine prep. Furthur cleansed with alcohol. Topical analgesic spray: Ethyl chloride. Joint: left subacromial injection Approached in typical fashion with: posterior approach Completed without difficulty Meds: 3 cc lidocaine 2% no epi, 1 cc depomedrol 80mg /cc Needle:1.5 inch 25 gauge Aftercare instructions and Red flags advised. Immediate improvement in pain noted  - methylPREDNISolone acetate (DEPO-MEDROL) injection 80 mg  , NP

## 2020-07-27 IMAGING — DX DG SHOULDER 2+V*L*
3 series · 3 of 3 positions shown · non-contrast
Comparison: None.

CLINICAL DATA: Left shoulder pain.

EXAM:
LEFT SHOULDER - 2+ VIEW

[shoulder internal rotation ap]
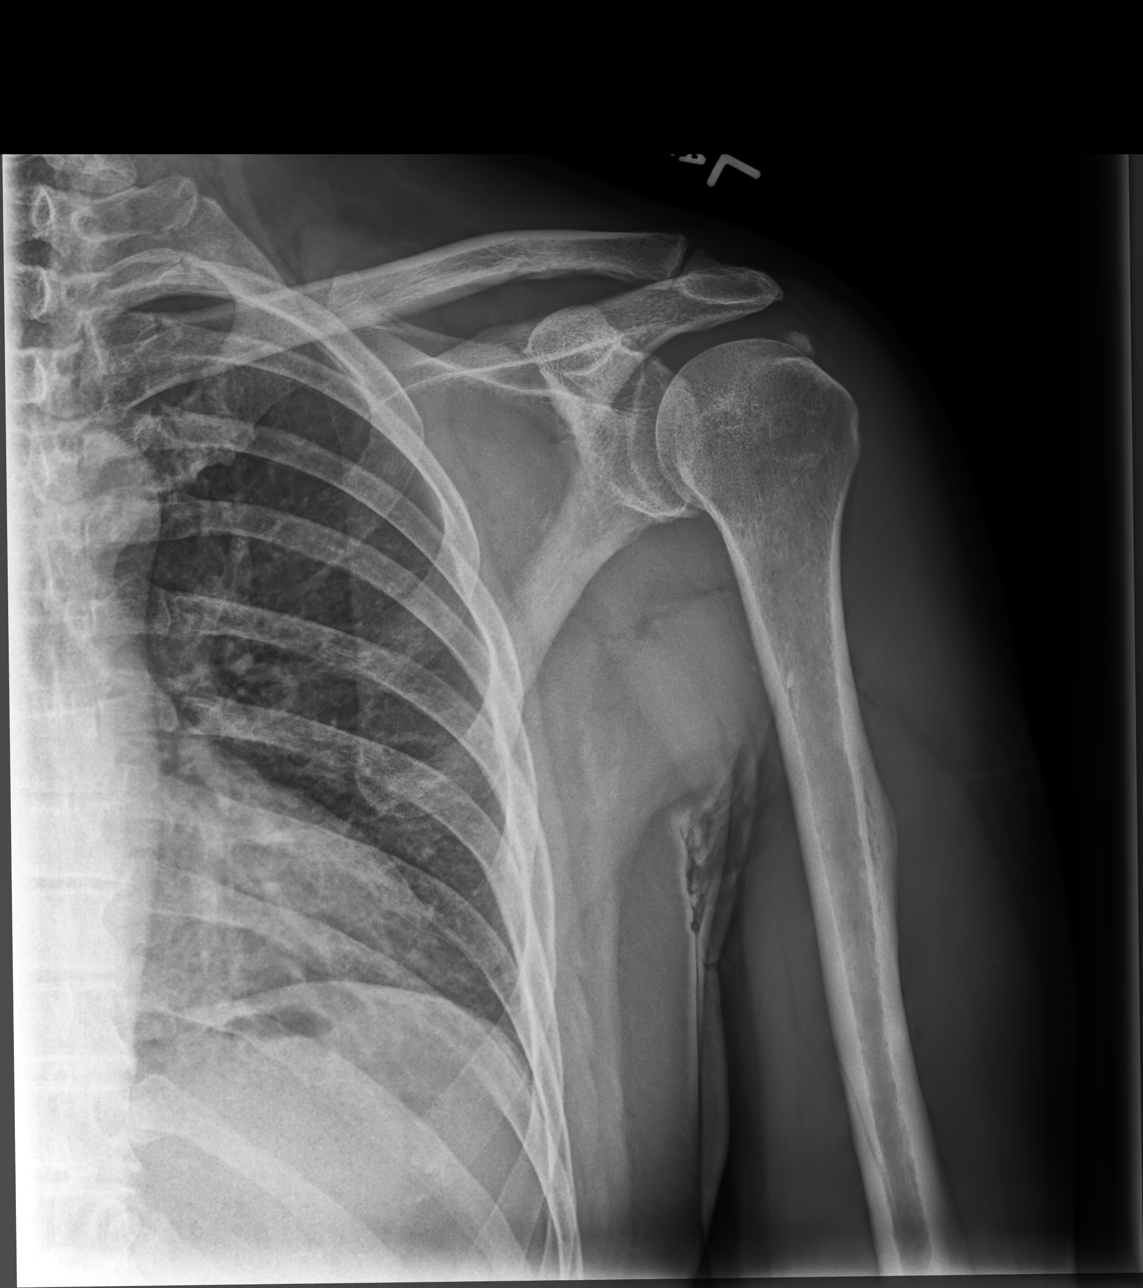

[shoulder external rotation ap]
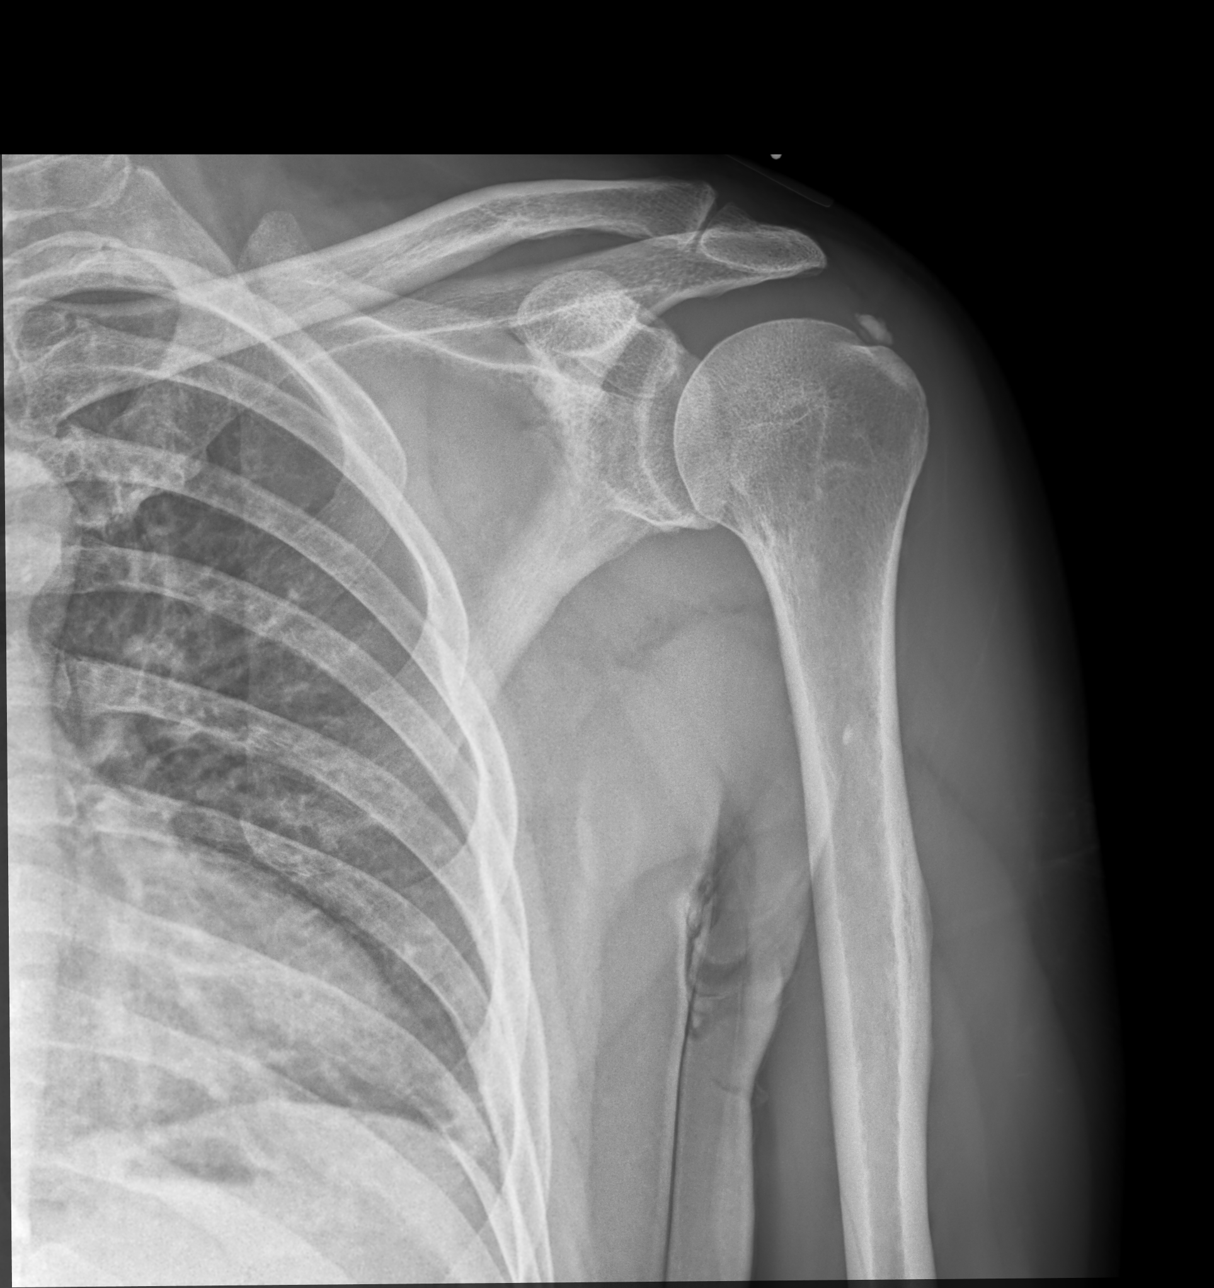

[shoulder (axial)]
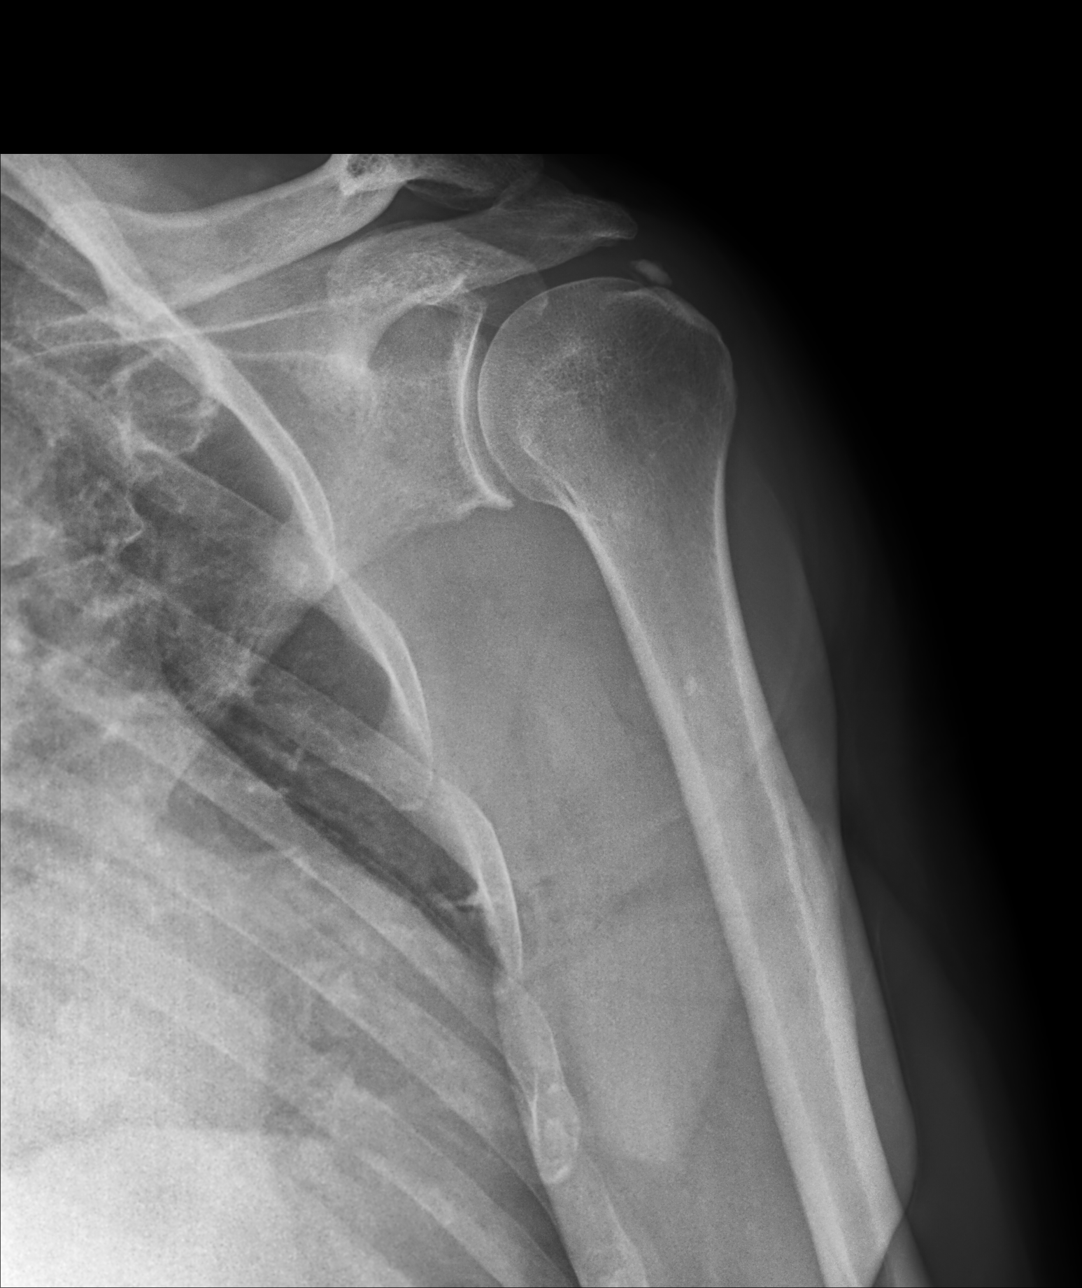

[3 of 3 positions shown; findings below may reference images not displayed]

FINDINGS: There is no evidence of fracture or dislocation. Mild degenerative
changes seen involving the left acromioclavicular joint.
Calcification is seen over greater tuberosity concerning for
calcific tendinosis. Glenohumeral joint is unremarkable. Soft
tissues are unremarkable.
IMPRESSION: No fracture or dislocation is noted. Calcifications seen over
greater tuberosity concerning for calcific tendinosis. Mild
degenerative joint disease is seen involving the left
acromioclavicular joint.

## 2020-09-16 ENCOUNTER — Other Ambulatory Visit: Payer: Self-pay | Admitting: Adult Health

## 2020-09-16 DIAGNOSIS — F419 Anxiety disorder, unspecified: Secondary | ICD-10-CM

## 2020-09-17 NOTE — Telephone Encounter (Signed)
Sent to the pharmacy by e-scribe.  Pt has an upcoming appt for cpx.

## 2020-10-27 ENCOUNTER — Other Ambulatory Visit: Payer: Self-pay

## 2020-10-28 ENCOUNTER — Encounter: Payer: Self-pay | Admitting: Adult Health

## 2020-10-28 ENCOUNTER — Ambulatory Visit (INDEPENDENT_AMBULATORY_CARE_PROVIDER_SITE_OTHER): Payer: Managed Care, Other (non HMO) | Admitting: Adult Health

## 2020-10-28 VITALS — BP 118/70 | HR 68 | Temp 97.9°F | Ht 70.0 in | Wt 189.5 lb

## 2020-10-28 DIAGNOSIS — Z0000 Encounter for general adult medical examination without abnormal findings: Secondary | ICD-10-CM

## 2020-10-28 DIAGNOSIS — Z1159 Encounter for screening for other viral diseases: Secondary | ICD-10-CM

## 2020-10-28 DIAGNOSIS — E782 Mixed hyperlipidemia: Secondary | ICD-10-CM | POA: Diagnosis not present

## 2020-10-28 DIAGNOSIS — Z125 Encounter for screening for malignant neoplasm of prostate: Secondary | ICD-10-CM | POA: Diagnosis not present

## 2020-10-28 DIAGNOSIS — F419 Anxiety disorder, unspecified: Secondary | ICD-10-CM

## 2020-10-28 LAB — CBC WITH DIFFERENTIAL/PLATELET
Basophils Absolute: 0.1 10*3/uL (ref 0.0–0.1)
Basophils Relative: 1 % (ref 0.0–3.0)
Eosinophils Absolute: 0.2 10*3/uL (ref 0.0–0.7)
Eosinophils Relative: 3.9 % (ref 0.0–5.0)
HCT: 45.4 % (ref 39.0–52.0)
Hemoglobin: 15.5 g/dL (ref 13.0–17.0)
Lymphocytes Relative: 29.5 % (ref 12.0–46.0)
Lymphs Abs: 1.9 10*3/uL (ref 0.7–4.0)
MCHC: 34.3 g/dL (ref 30.0–36.0)
MCV: 86.6 fl (ref 78.0–100.0)
Monocytes Absolute: 0.5 10*3/uL (ref 0.1–1.0)
Monocytes Relative: 8.6 % (ref 3.0–12.0)
Neutro Abs: 3.6 10*3/uL (ref 1.4–7.7)
Neutrophils Relative %: 57 % (ref 43.0–77.0)
Platelets: 216 10*3/uL (ref 150.0–400.0)
RBC: 5.24 Mil/uL (ref 4.22–5.81)
RDW: 12.5 % (ref 11.5–15.5)
WBC: 6.3 10*3/uL (ref 4.0–10.5)

## 2020-10-28 LAB — COMPREHENSIVE METABOLIC PANEL
ALT: 32 U/L (ref 0–53)
AST: 19 U/L (ref 0–37)
Albumin: 4.6 g/dL (ref 3.5–5.2)
Alkaline Phosphatase: 47 U/L (ref 39–117)
BUN: 18 mg/dL (ref 6–23)
CO2: 30 mEq/L (ref 19–32)
Calcium: 9.4 mg/dL (ref 8.4–10.5)
Chloride: 103 mEq/L (ref 96–112)
Creatinine, Ser: 1.04 mg/dL (ref 0.40–1.50)
GFR: 79.37 mL/min (ref >60.00)
Glucose, Bld: 114 mg/dL — ABNORMAL HIGH (ref 70–99)
Potassium: 4.3 mEq/L (ref 3.5–5.1)
Sodium: 140 mEq/L (ref 135–145)
Total Bilirubin: 1.7 mg/dL — ABNORMAL HIGH (ref 0.2–1.2)
Total Protein: 7.3 g/dL (ref 6.0–8.3)

## 2020-10-28 LAB — TSH: TSH: 1.63 u[IU]/mL (ref 0.35–4.50)

## 2020-10-28 LAB — LIPID PANEL
Cholesterol: 170 mg/dL (ref 0–200)
HDL: 30.7 mg/dL — ABNORMAL LOW (ref >39.00)
NonHDL: 139.04
Total CHOL/HDL Ratio: 6
Triglycerides: 237 mg/dL — ABNORMAL HIGH (ref 0.0–149.0)
VLDL: 47.4 mg/dL — ABNORMAL HIGH (ref 0.0–40.0)

## 2020-10-28 LAB — LDL CHOLESTEROL, DIRECT: Direct LDL: 88 mg/dL

## 2020-10-28 LAB — PSA: PSA: 0.9 ng/mL (ref 0.10–4.00)

## 2020-10-28 NOTE — Progress Notes (Signed)
Subjective:    Patient ID: Darius Lopez, male    DOB: [birth year], 60 y.o.   MRN: [id number]  HPI  Patient presents for yearly preventative medicine examination. He is a very pleasant 60 year old male who  has a past medical history of Complication of anesthesia, Essential hypertension, and Hyperlipidemia.   Hyperlipidemia -prescribed Lipitor 20 mg daily and takes omega-3 fatty acid.  He denies myalgia or fatigue Lab Results  Component Value Date   CHOL 184 10/27/2018   HDL 35.60 (L) 10/27/2018   LDLCALC 115 (H) 10/27/2018   LDLDIRECT 105.0 09/26/2017   TRIG 166.0 (H) 10/27/2018   CHOLHDL 5 10/27/2018   Anxiety-well-controlled with Zoloft 50 mg.  Anxiety stems from work and family stressors.  Denies depressive feelings.   All immunizations and health maintenance protocols were reviewed with the patient and needed orders were placed.  Appropriate screening laboratory values were ordered for the patient including screening of hyperlipidemia, renal function and hepatic function. If indicated by BPH, a PSA was ordered.  Medication reconciliation,  past medical history, social history, problem list and allergies were reviewed in detail with the patient  Goals were established with regard to weight loss, exercise, and  diet in compliance with medications  He is up-to-date on routine colon cancer screening  He has no acute complaints    Review of Systems  Constitutional: Negative.   HENT: Negative.   Eyes: Negative.   Respiratory: Negative.   Cardiovascular: Negative.   Gastrointestinal: Negative.   Endocrine: Negative.   Genitourinary: Negative.   Musculoskeletal: Negative.   Skin: Negative.   Allergic/Immunologic: Negative.   Neurological: Negative.   Hematological: Negative.   Psychiatric/Behavioral: Negative.   All other systems reviewed and are negative.    Past Medical History:  Diagnosis Date  . Complication of anesthesia    "Hard to intubate"per pt/per  anesthesiologist in [place] over 20 years ago!  . Essential hypertension   . Hyperlipidemia     Social History   Socioeconomic History  . Marital status: Married    Spouse name: Not on file  . Number of children: Not on file  . Years of education: Not on file  . Highest education level: Not on file  Occupational History  . Occupation: Associate Professor: [employer]  Tobacco Use  . Smoking status: Never Smoker  . Smokeless tobacco: Never Used  Vaping Use  . Vaping Use: Never used  Substance and Sexual Activity  . Alcohol use: Yes    Comment: occasional  . Drug use: No  . Sexual activity: Not on file  Other Topics Concern  . Not on file  Social History Narrative   Married   Has three children ( 30, 43. 22)    Likes to [place] and do BJJ   Social Determinants of Corporate investment banker Strain: Not on file  Food Insecurity: Not on file  Transportation Needs: Not on file  Physical Activity: Not on file  Stress: Not on file  Social Connections: Not on file  Intimate Partner Violence: Not on file    Past Surgical History:  Procedure Laterality Date  . COLONOSCOPY N/A 02/03/2018   Procedure: COLONOSCOPY;  Surgeon: Sherrilyn Rist, MD;  Location: [facility];  Service: Gastroenterology;  Laterality: N/A;  . VASECTOMY     and then vasectomy reversal in 2001  . VASECTOMY REVERSAL      Family History  Problem Relation Age of Onset  .  Heart disease Mother   . Mitral valve prolapse Mother   . Prostate cancer Maternal Grandfather   . Colon cancer Neg Hx   . Colon polyps Neg Hx   . Esophageal cancer Neg Hx   . Rectal cancer Neg Hx   . Stomach cancer Neg Hx   . Pancreatic cancer Neg Hx     No Known Allergies  Current Outpatient Medications on File Prior to Visit  Medication Sig Dispense Refill  . atorvastatin (LIPITOR) 20 MG tablet TAKE 1 TABLET BY MOUTH DAILY. **DUE FOR YEARLY PHYSICAL** 90 tablet 3  . Omega-3 Fatty Acids (FISH OIL) 1200 MG CAPS Take  by mouth daily. Take 1 pills daily    . sertraline (ZOLOFT) 50 MG tablet TAKE 1 TABLET BY MOUTH EVERY DAY 90 tablet 0   No current facility-administered medications on file prior to visit.    BP 118/70   Pulse 68   Temp 97.9 F (36.6 C) (Oral)   Ht 5\' 10"  (1.778 m)   Wt 189 lb 8 oz (86 kg)   SpO2 97%   BMI 27.19 kg/m       Objective:   Physical Exam Vitals and nursing note reviewed.  Constitutional:      General: He is not in acute distress.    Appearance: Normal appearance. He is well-developed and normal weight.  HENT:     Head: Normocephalic and atraumatic.     Right Ear: Tympanic membrane, ear canal and external ear normal. There is no impacted cerumen.     Left Ear: Tympanic membrane, ear canal and external ear normal. There is no impacted cerumen.     Nose: Nose normal. No congestion or rhinorrhea.     Mouth/Throat:     Mouth: Mucous membranes are moist.     Pharynx: Oropharynx is clear. No oropharyngeal exudate or posterior oropharyngeal erythema.  Eyes:     General:        Right eye: No discharge.        Left eye: No discharge.     Extraocular Movements: Extraocular movements intact.     Conjunctiva/sclera: Conjunctivae normal.     Pupils: Pupils are equal, round, and reactive to light.  Neck:     Vascular: No carotid bruit.     Trachea: No tracheal deviation.  Cardiovascular:     Rate and Rhythm: Normal rate and regular rhythm.     Pulses: Normal pulses.     Heart sounds: Normal heart sounds. No murmur heard. No friction rub. No gallop.   Pulmonary:     Effort: Pulmonary effort is normal. No respiratory distress.     Breath sounds: Normal breath sounds. No stridor. No wheezing, rhonchi or rales.  Chest:     Chest wall: No tenderness.  Abdominal:     General: Bowel sounds are normal. There is no distension.     Palpations: Abdomen is soft. There is no mass.     Tenderness: There is no abdominal tenderness. There is no right CVA tenderness, left CVA  tenderness, guarding or rebound.     Hernia: No hernia is present.  Musculoskeletal:        General: No swelling, tenderness, deformity or signs of injury. Normal range of motion.     Right lower leg: No edema.     Left lower leg: No edema.  Lymphadenopathy:     Cervical: No cervical adenopathy.  Skin:    General: Skin is warm and dry.  Capillary Refill: Capillary refill takes less than 2 seconds.     Coloration: Skin is not jaundiced or pale.     Findings: No bruising, erythema, lesion or rash.  Neurological:     General: No focal deficit present.     Mental Status: He is alert and oriented to person, place, and time.     Cranial Nerves: No cranial nerve deficit.     Sensory: No sensory deficit.     Motor: No weakness.     Coordination: Coordination normal.     Gait: Gait normal.     Deep Tendon Reflexes: Reflexes normal.  Psychiatric:        Mood and Affect: Mood normal.        Behavior: Behavior normal.        Thought Content: Thought content normal.        Judgment: Judgment normal.       Assessment & Plan:  1. Routine general medical examination at a health care facility - Benign exam  - Follow up in one year or sooner if needed - CBC with Differential/Platelet; Future - Comprehensive metabolic panel; Future - Lipid panel; Future - TSH; Future  2. Anxiety - Continue Zoloft 50 mg   3. Mixed hyperlipidemia - Consider increase in statin therapy  - CBC with Differential/Platelet; Future - Comprehensive metabolic panel; Future - Lipid panel; Future - TSH; Future  4. Need for hepatitis C screening test  - Hep C Antibody; Future  5. Prostate cancer screening  - PSA; Future  Shirline Frees, NP

## 2020-10-29 LAB — HEPATITIS C ANTIBODY
Hepatitis C Ab: NONREACTIVE
SIGNAL TO CUT-OFF: 0.03 (ref <1.00)

## 2020-12-11 ENCOUNTER — Ambulatory Visit: Payer: Managed Care, Other (non HMO) | Admitting: Adult Health

## 2020-12-11 ENCOUNTER — Other Ambulatory Visit: Payer: Self-pay

## 2020-12-11 ENCOUNTER — Encounter: Payer: Self-pay | Admitting: Adult Health

## 2020-12-11 VITALS — BP 140/74 | HR 68 | Temp 98.5°F | Wt 191.2 lb

## 2020-12-11 DIAGNOSIS — S46211A Strain of muscle, fascia and tendon of other parts of biceps, right arm, initial encounter: Secondary | ICD-10-CM

## 2020-12-11 NOTE — Progress Notes (Signed)
Subjective:    Patient ID: Darius Lopez, male    DOB: [birth year], 60 y.o.   MRN: [id number]  HPI 60 year old male who  has a past medical history of Complication of anesthesia, Essential hypertension, and Hyperlipidemia.  He presents to the office today for an acute issue. Reports that he was zip lining about a week ago and one of the lines hit him in the right upper arm causing a large " ball"   Reports having a painful ( with palpation) knot on the upper right bicept that extends into the axilla. No pain with movement or flexion of the bicep muscle.     Review of Systems See HPI   Past Medical History:  Diagnosis Date  . Complication of anesthesia    "Hard to intubate"per pt/per anesthesiologist in [place] over 20 years ago!  . Essential hypertension   . Hyperlipidemia     Social History   Socioeconomic History  . Marital status: Married    Spouse name: Not on file  . Number of children: Not on file  . Years of education: Not on file  . Highest education level: Not on file  Occupational History  . Occupation: Associate Professor: [employer]  Tobacco Use  . Smoking status: Never Smoker  . Smokeless tobacco: Never Used  Vaping Use  . Vaping Use: Never used  Substance and Sexual Activity  . Alcohol use: Yes    Comment: occasional  . Drug use: No  . Sexual activity: Not on file  Other Topics Concern  . Not on file  Social History Narrative   Married   Has three children ( 30, 34. 21)    Likes to [place] and do BJJ   Social Determinants of Corporate investment banker Strain: Not on file  Food Insecurity: Not on file  Transportation Needs: Not on file  Physical Activity: Not on file  Stress: Not on file  Social Connections: Not on file  Intimate Partner Violence: Not on file    Past Surgical History:  Procedure Laterality Date  . COLONOSCOPY N/A 02/03/2018   Procedure: COLONOSCOPY;  Surgeon: Sherrilyn Rist, MD;  Location: [facility];  Service:  Gastroenterology;  Laterality: N/A;  . VASECTOMY     and then vasectomy reversal in 2001  . VASECTOMY REVERSAL      Family History  Problem Relation Age of Onset  . Heart disease Mother   . Mitral valve prolapse Mother   . Prostate cancer Maternal Grandfather   . Colon cancer Neg Hx   . Colon polyps Neg Hx   . Esophageal cancer Neg Hx   . Rectal cancer Neg Hx   . Stomach cancer Neg Hx   . Pancreatic cancer Neg Hx     No Known Allergies  Current Outpatient Medications on File Prior to Visit  Medication Sig Dispense Refill  . atorvastatin (LIPITOR) 20 MG tablet TAKE 1 TABLET BY MOUTH DAILY. **DUE FOR YEARLY PHYSICAL** 90 tablet 3  . Omega-3 Fatty Acids (FISH OIL) 1200 MG CAPS Take by mouth daily. Take 1 pills daily    . sertraline (ZOLOFT) 50 MG tablet TAKE 1 TABLET BY MOUTH EVERY DAY 90 tablet 0   No current facility-administered medications on file prior to visit.    BP 140/74 (BP Location: Left Arm, Patient Position: Sitting, Cuff Size: Normal)   Pulse 68   Temp 98.5 F (36.9 C) (Oral)   Wt 191 lb 3.2  oz (86.7 kg)   SpO2 98%   BMI 27.43 kg/m       Objective:   Physical Exam Vitals and nursing note reviewed.  Constitutional:      Appearance: Normal appearance.  Cardiovascular:     Rate and Rhythm: Normal rate and regular rhythm.     Pulses: Normal pulses.     Heart sounds: Normal heart sounds.  Musculoskeletal:        General: Swelling and tenderness present. Normal range of motion.     Right upper arm: Deformity and tenderness present.     Comments: Ball like mass in upper right arm. Tenderness along tendon of short head of right bicep  Skin:    General: Skin is warm and dry.     Capillary Refill: Capillary refill takes less than 2 seconds.  Neurological:     General: No focal deficit present.     Mental Status: He is alert and oriented to person, place, and time.  Psychiatric:        Mood and Affect: Mood normal.        Behavior: Behavior normal.         Thought Content: Thought content normal.        Judgment: Judgment normal.        Assessment & Plan:  1. Rupture of right biceps tendon, initial encounter - Advised Nsaids, rest, and ice - AMB referral to orthopedics  Shirline Frees, NP

## 2020-12-12 ENCOUNTER — Ambulatory Visit: Payer: Managed Care, Other (non HMO) | Admitting: Orthopedic Surgery

## 2020-12-12 DIAGNOSIS — S46211A Strain of muscle, fascia and tendon of other parts of biceps, right arm, initial encounter: Secondary | ICD-10-CM

## 2020-12-13 ENCOUNTER — Encounter: Payer: Self-pay | Admitting: Orthopedic Surgery

## 2020-12-13 NOTE — Progress Notes (Signed)
Office Visit Note   Patient: Darius Lopez           Date of Birth: [birth year]           MRN: [id number] Visit Date: 12/12/2020 Requested by: Shirline Frees, NP [address] [place],  [place] [address] PCP: Shirline Frees, NP  Subjective: Chief Complaint  Patient presents with  . 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  This occurred when he was on vacation doing a zip line.  Rope was caught around his axilla region and then around his elbow.  This was a zip line and the cable wrapped around his arm.  Had some deformity just distal to the axillary crease with focal swelling along the proximal inner aspect of the arm.  Denies any strength reduction.  Did not dislocate his shoulder.  Does describe having rope burn in his elbow flexion crease on the right-hand side.              ROS: All systems reviewed are negative as they relate to the chief complaint within the history of present illness.  Patient denies  fevers or chills.   Assessment & Plan: Visit Diagnoses:  1. Biceps muscle tear, right, initial encounter     Plan: Impression is intact proximal and distal biceps attachments.  Patient has partial tearing at the musculocutaneous junction of the proximal biceps.  Strength is excellent.  Ultrasound examination does show some resolving hematoma and focal interstitial muscle tearing.  I think that should be a self-limited problem.  The swelling should resolve over the next several weeks.  Proximally and distally the biceps tendon is intact.  He does have some road burn in the elbow flexion crease which should also improve over time.  We discussed aspiration of the hematoma today but at this time based on time from injury I think it is likely that this is primarily hematoma and it would be unpredictable as to whether or not we could actually aspirate anything from that area.  Do anticipate some long-term  scarring of the muscle in that region but it should not give him any functional or cosmetic difficulty.  Follow-Up Instructions: Return if symptoms worsen or fail to improve.   Orders:  No orders of the defined types were placed in this encounter.  No orders of the defined types were placed in this encounter.     Procedures: No procedures performed   Clinical Data: No additional findings.  Objective: Vital Signs: There were no vitals taken for this visit.  Physical Exam:   Constitutional: Patient appears well-developed HEENT:  Head: Normocephalic Eyes:EOM are normal Neck: Normal range of motion Cardiovascular: Normal rate Pulmonary/chest: Effort normal Neurologic: Patient is alert Skin: Skin is warm Psychiatric: Patient has normal mood and affect    Ortho Exam: Ortho exam demonstrates full active and passive range of motion of the right shoulder.  Does have rope burn in the elbow flexion crease.  Distal and proximal biceps attachments palpable and intact.  Patient has focal swelling 4 x 5 cm just distal to the axillary crease on the right-hand side.  Rotator cuff strength is intact infraspinatus supraspinatus and subscap muscle testing on the right.  Shoulder stable.  Patient has excellent elbow flexion as well as supination strength on the right-hand side compared to the left.  Ultrasound examination demonstrates focal hematoma in this region along with some  tearing of the muscle fibers but no discrete proximal or distal biceps tendon injury.  Specialty Comments:  No specialty comments available.  Imaging: No results found.   PMFS History: Patient Active Problem List   Diagnosis Date Noted  . Anxiety 10/25/2018  . Screening for colon cancer    Past Medical History:  Diagnosis Date  . Complication of anesthesia    "Hard to intubate"per pt/per anesthesiologist in [place] over 20 years ago!  . Essential hypertension   . Hyperlipidemia     Family History  Problem  Relation Age of Onset  . Heart disease Mother   . Mitral valve prolapse Mother   . Prostate cancer Maternal Grandfather   . Colon cancer Neg Hx   . Colon polyps Neg Hx   . Esophageal cancer Neg Hx   . Rectal cancer Neg Hx   . Stomach cancer Neg Hx   . Pancreatic cancer Neg Hx     Past Surgical History:  Procedure Laterality Date  . COLONOSCOPY N/A 02/03/2018   Procedure: COLONOSCOPY;  Surgeon: Sherrilyn Rist, MD;  Location: [facility];  Service: Gastroenterology;  Laterality: N/A;  . VASECTOMY     and then vasectomy reversal in 2001  . VASECTOMY REVERSAL     Social History   Occupational History  . Occupation: Associate Professor: [employer]  Tobacco Use  . Smoking status: Never Smoker  . Smokeless tobacco: Never Used  Vaping Use  . Vaping Use: Never used  Substance and Sexual Activity  . Alcohol use: Yes    Comment: occasional  . Drug use: No  . Sexual activity: Not on file

## 2020-12-14 ENCOUNTER — Other Ambulatory Visit: Payer: Self-pay | Admitting: Adult Health

## 2020-12-14 DIAGNOSIS — F419 Anxiety disorder, unspecified: Secondary | ICD-10-CM

## 2021-06-30 ENCOUNTER — Encounter: Payer: Self-pay | Admitting: Adult Health

## 2021-06-30 ENCOUNTER — Ambulatory Visit: Payer: Managed Care, Other (non HMO) | Admitting: Adult Health

## 2021-06-30 VITALS — BP 140/98 | HR 79 | Temp 99.1°F | Ht 70.0 in | Wt 191.0 lb

## 2021-06-30 DIAGNOSIS — M25512 Pain in left shoulder: Secondary | ICD-10-CM

## 2021-06-30 DIAGNOSIS — G8929 Other chronic pain: Secondary | ICD-10-CM

## 2021-06-30 MED ORDER — METHYLPREDNISOLONE ACETATE 80 MG/ML IJ SUSP
80.0000 mg | Freq: Once | INTRAMUSCULAR | Status: AC
  Administered 2021-06-30: 80 mg via INTRA_ARTICULAR

## 2021-06-30 NOTE — Progress Notes (Signed)
Subjective:    Patient ID: Darius Lopez, male    DOB: Mar 01, 1961, 60 y.o.   MRN: [id number]  HPI 60 year old male who  has a past medical history of Complication of anesthesia, Essential hypertension, and Hyperlipidemia.  He presents to the office today for acute on chronic left shoulder pain. In the past we have done steroid injections and the pain resolved. Last injection was done one year ago He denies loss of ROM ,clicking, or tightness in the shoulder.   Pain started about a week ago.   Review of Systems See HPI   Past Medical History:  Diagnosis Date   Complication of anesthesia    "Hard to intubate"per pt/per anesthesiologist in [place] over 20 years ago!   Essential hypertension    Hyperlipidemia     Social History   Socioeconomic History   Marital status: Married    Spouse name: Not on file   Number of children: Not on file   Years of education: Not on file   Highest education level: Not on file  Occupational History   Occupation: Associate Professor: [employer]  Tobacco Use   Smoking status: Never   Smokeless tobacco: Never  Vaping Use   Vaping Use: Never used  Substance and Sexual Activity   Alcohol use: Yes    Comment: occasional   Drug use: No   Sexual activity: Not on file  Other Topics Concern   Not on file  Social History Narrative   Married   Has three children ( 30, 49. 13)    Likes to [place] and do BJJ   Social Determinants of Health   Financial Resource Strain: Not on file  Food Insecurity: Not on file  Transportation Needs: Not on file  Physical Activity: Not on file  Stress: Not on file  Social Connections: Not on file  Intimate Partner Violence: Not on file    Past Surgical History:  Procedure Laterality Date   COLONOSCOPY N/A 02/03/2018   Procedure: COLONOSCOPY;  Surgeon: Sherrilyn Rist, MD;  Location: [facility];  Service: Gastroenterology;  Laterality: N/A;   VASECTOMY     and then vasectomy reversal in 2001    VASECTOMY REVERSAL      Family History  Problem Relation Age of Onset   Heart disease Mother    Mitral valve prolapse Mother    Prostate cancer Maternal Grandfather    Colon cancer Neg Hx    Colon polyps Neg Hx    Esophageal cancer Neg Hx    Rectal cancer Neg Hx    Stomach cancer Neg Hx    Pancreatic cancer Neg Hx     No Known Allergies  Current Outpatient Medications on File Prior to Visit  Medication Sig Dispense Refill   atorvastatin (LIPITOR) 20 MG tablet TAKE 1 TABLET BY MOUTH DAILY. **DUE FOR YEARLY PHYSICAL** 90 tablet 3   Omega-3 Fatty Acids (FISH OIL) 1200 MG CAPS Take by mouth daily. Take 1 pills daily     sertraline (ZOLOFT) 50 MG tablet TAKE 1 TABLET BY MOUTH EVERY DAY 90 tablet 0   No current facility-administered medications on file prior to visit.    BP (!) 140/98   Pulse 79   Temp 99.1 F (37.3 C) (Oral)   Ht 5\' 10"  (1.778 m)   Wt 191 lb (86.6 kg)   SpO2 95%   BMI 27.41 kg/m       Objective:   Physical Exam Vitals  and nursing note reviewed.  Constitutional:      Appearance: Normal appearance.  Cardiovascular:     Rate and Rhythm: Normal rate and regular rhythm.     Pulses: Normal pulses.     Heart sounds: Normal heart sounds.  Pulmonary:     Effort: Pulmonary effort is normal.     Breath sounds: Normal breath sounds.  Musculoskeletal:        General: Tenderness present. Normal range of motion.  Skin:    General: Skin is warm and dry.     Capillary Refill: Capillary refill takes less than 2 seconds.  Neurological:     Mental Status: He is alert.      Assessment & Plan:  1. Chronic left shoulder pain Shoulder injection Verbal consent obtained and verified. Sterile betadine prep. Furthur cleansed with alcohol. Topical analgesic spray: Ethyl chloride. Joint: left subacromial injection Approached in typical fashion with: posterior approach Completed without difficulty Meds: 3 cc lidocaine 2% no epi, 1 cc depomedrol 80mg /cc Needle:1.5  inch 25 gauge Aftercare instructions and Red flags advised. Immediate improvement in pain noted  - methylPREDNISolone acetate (DEPO-MEDROL) injection 80 mg   , NP

## 2021-07-21 ENCOUNTER — Other Ambulatory Visit: Payer: Self-pay | Admitting: Adult Health

## 2021-07-21 DIAGNOSIS — F419 Anxiety disorder, unspecified: Secondary | ICD-10-CM

## 2021-10-23 ENCOUNTER — Other Ambulatory Visit: Payer: Self-pay | Admitting: Adult Health

## 2021-10-23 DIAGNOSIS — F419 Anxiety disorder, unspecified: Secondary | ICD-10-CM

## 2022-01-06 ENCOUNTER — Encounter: Payer: Self-pay | Admitting: Adult Health

## 2022-01-06 ENCOUNTER — Ambulatory Visit (INDEPENDENT_AMBULATORY_CARE_PROVIDER_SITE_OTHER): Payer: Managed Care, Other (non HMO) | Admitting: Adult Health

## 2022-01-06 ENCOUNTER — Other Ambulatory Visit: Payer: Self-pay | Admitting: Adult Health

## 2022-01-06 VITALS — BP 140/82 | HR 66 | Temp 98.7°F | Ht 70.5 in | Wt 192.5 lb

## 2022-01-06 DIAGNOSIS — I10 Essential (primary) hypertension: Secondary | ICD-10-CM

## 2022-01-06 DIAGNOSIS — Z0000 Encounter for general adult medical examination without abnormal findings: Secondary | ICD-10-CM | POA: Diagnosis not present

## 2022-01-06 DIAGNOSIS — Z114 Encounter for screening for human immunodeficiency virus [HIV]: Secondary | ICD-10-CM

## 2022-01-06 DIAGNOSIS — Z125 Encounter for screening for malignant neoplasm of prostate: Secondary | ICD-10-CM

## 2022-01-06 DIAGNOSIS — F419 Anxiety disorder, unspecified: Secondary | ICD-10-CM | POA: Diagnosis not present

## 2022-01-06 DIAGNOSIS — E782 Mixed hyperlipidemia: Secondary | ICD-10-CM | POA: Diagnosis not present

## 2022-01-06 LAB — CBC WITH DIFFERENTIAL/PLATELET
Basophils Absolute: 0.1 10*3/uL (ref 0.0–0.1)
Basophils Relative: 1.1 % (ref 0.0–3.0)
Eosinophils Absolute: 0.3 10*3/uL (ref 0.0–0.7)
Eosinophils Relative: 4.6 % (ref 0.0–5.0)
HCT: 47.6 % (ref 39.0–52.0)
Hemoglobin: 16 g/dL (ref 13.0–17.0)
Lymphocytes Relative: 25.8 % (ref 12.0–46.0)
Lymphs Abs: 1.6 10*3/uL (ref 0.7–4.0)
MCHC: 33.6 g/dL (ref 30.0–36.0)
MCV: 87.3 fl (ref 78.0–100.0)
Monocytes Absolute: 0.5 10*3/uL (ref 0.1–1.0)
Monocytes Relative: 8.3 % (ref 3.0–12.0)
Neutro Abs: 3.6 10*3/uL (ref 1.4–7.7)
Neutrophils Relative %: 60.2 % (ref 43.0–77.0)
Platelets: 192 10*3/uL (ref 150.0–400.0)
RBC: 5.46 Mil/uL (ref 4.22–5.81)
RDW: 13 % (ref 11.5–15.5)
WBC: 6 10*3/uL (ref 4.0–10.5)

## 2022-01-06 LAB — LIPID PANEL
Cholesterol: 229 mg/dL — ABNORMAL HIGH (ref 0–200)
HDL: 38.2 mg/dL — ABNORMAL LOW (ref >39.00)
NonHDL: 190.53
Total CHOL/HDL Ratio: 6
Triglycerides: 400 mg/dL — ABNORMAL HIGH (ref 0.0–149.0)
VLDL: 80 mg/dL — ABNORMAL HIGH (ref 0.0–40.0)

## 2022-01-06 LAB — PSA: PSA: 0.82 ng/mL (ref 0.10–4.00)

## 2022-01-06 LAB — LDL CHOLESTEROL, DIRECT: Direct LDL: 95 mg/dL

## 2022-01-06 LAB — TSH: TSH: 1.43 u[IU]/mL (ref 0.35–5.50)

## 2022-01-06 MED ORDER — ATORVASTATIN CALCIUM 40 MG PO TABS: 40.0000 mg | ORAL_TABLET | Freq: Every day | ORAL | 3 refills | Status: AC

## 2022-01-06 MED ORDER — LISINOPRIL 5 MG PO TABS: 5.0000 mg | ORAL_TABLET | Freq: Every day | ORAL | 3 refills | Status: AC

## 2022-01-06 NOTE — Progress Notes (Signed)
Subjective:    Patient ID: Darius Lopez, male    DOB: 11/22/1960, 61 y.o.   MRN: [id number]  HPI Patient presents for yearly preventative medicine examination. He is a pleasant 61 year old male who  has a past medical history of Complication of anesthesia, Essential hypertension, and Hyperlipidemia.  Hyperlipidemia- no longer taking statin medication Lab Results  Component Value Date   CHOL 170 10/28/2020   HDL 30.70 (L) 10/28/2020   LDLCALC 115 (H) 10/27/2018   LDLDIRECT 88.0 10/28/2020   TRIG 237.0 (H) 10/28/2020   CHOLHDL 6 10/28/2020   Anxiety -stopped taking  Zoloft 50 mg daily He plans to go back on it due to anxiety from work .  Anxiety stems from work and family stressors  HTN - has been on lisinopril in the past but was able to come off it. Has started " feeling my blood pressure go up". He does not check at home. Has been experiencing headaches on occasion.   BP Readings from Last 3 Encounters:  01/06/22 140/82  06/30/21 (!) 140/98  12/11/20 140/74     All immunizations and health maintenance protocols were reviewed with the patient and needed orders were placed.  Appropriate screening laboratory values were ordered for the patient including screening of hyperlipidemia, renal function and hepatic function. If indicated by BPH, a PSA was ordered.  Medication reconciliation,  past medical history, social history, problem list and allergies were reviewed in detail with the patient  Goals were established with regard to weight loss, exercise, and  diet in compliance with medications.  He works out quite a benign aerobic exercise.  He tries to eat a heart healthy diet Wt Readings from Last 3 Encounters:  01/06/22 192 lb 8 oz (87.3 kg)  06/30/21 191 lb (86.6 kg)  12/11/20 191 lb 3.2 oz (86.7 kg)   He is up-to-date on routine colon cancer screening  Review of Systems  Constitutional: Negative.   HENT: Negative.    Eyes: Negative.   Respiratory: Negative.     Cardiovascular: Negative.   Gastrointestinal: Negative.   Endocrine: Negative.   Genitourinary: Negative.   Musculoskeletal: Negative.   Skin: Negative.   Allergic/Immunologic: Negative.   Neurological: Negative.   Hematological: Negative.   Psychiatric/Behavioral:  The patient is nervous/anxious.   All other systems reviewed and are negative.  Past Medical History:  Diagnosis Date   Complication of anesthesia    "Hard to intubate"per pt/[place] over 20 years ago!   Essential hypertension    Hyperlipidemia     Social History   Socioeconomic History   Marital status: Married    Spouse name: Not on file   Number of children: Not on file   Years of education: Not on file   Highest education level: Not on file  Occupational History   Occupation: Associate ProfessorCorporate    Employer: [employer]  Tobacco Use   Smoking status: Never   Smokeless tobacco: Never  Vaping Use   Vaping Use: Never used  Substance and Sexual Activity   Alcohol use: Yes    Comment: occasional   Drug use: No   Sexual activity: Not on file  Other Topics Concern   Not on file  Social History Narrative   Married   Has three children ( 30, [phone]. [phone])    Likes to [place] and do BJJ   Social Determinants of Corporate investment bankerHealth   Financial Resource Strain: Not on file  Food Insecurity: Not on file  Transportation Needs: Not on file  Physical Activity: Not on file  Stress: Not on file  Social Connections: Not on file  Intimate Partner Violence: Not on file    Past Surgical History:  Procedure Laterality Date   COLONOSCOPY N/A 02/03/2018   Procedure: COLONOSCOPY;  Surgeon: Sherrilyn Rist, MD;  Location: [facility];  Service: Gastroenterology;  Laterality: N/A;   VASECTOMY     and then vasectomy reversal in 2001   VASECTOMY REVERSAL      Family History  Problem Relation Age of Onset   Heart disease Mother    Mitral valve prolapse Mother    Prostate cancer Maternal Grandfather    Colon  cancer Neg Hx    Colon polyps Neg Hx    Esophageal cancer Neg Hx    Rectal cancer Neg Hx    Stomach cancer Neg Hx    Pancreatic cancer Neg Hx     No Known Allergies  Current Outpatient Medications on File Prior to Visit  Medication Sig Dispense Refill   atorvastatin (LIPITOR) 20 MG tablet TAKE 1 TABLET BY MOUTH DAILY. **DUE FOR YEARLY PHYSICAL** 90 tablet 3   Omega-3 Fatty Acids (FISH OIL) 1200 MG CAPS Take by mouth daily. Take 1 pills daily     sertraline (ZOLOFT) 50 MG tablet TAKE 1 TABLET BY MOUTH EVERY DAY (Patient not taking: Reported on 01/06/2022) 90 tablet 1   No current facility-administered medications on file prior to visit.    BP 140/82 (BP Location: Left Arm, Patient Position: Sitting, Cuff Size: Large)   Pulse 66   Temp 98.7 F (37.1 C) (Oral)   Ht 5' 10.5" (1.791 m)   Wt 192 lb 8 oz (87.3 kg)   SpO2 98%   BMI 27.23 kg/m       Objective:   Physical Exam Vitals and nursing note reviewed.  Constitutional:      General: He is not in acute distress.    Appearance: Normal appearance. He is well-developed and normal weight.  HENT:     Head: Normocephalic and atraumatic.     Right Ear: Tympanic membrane, ear canal and external ear normal. There is no impacted cerumen.     Left Ear: Tympanic membrane, ear canal and external ear normal. There is no impacted cerumen.     Nose: Nose normal. No congestion or rhinorrhea.     Mouth/Throat:     Mouth: Mucous membranes are moist.     Pharynx: Oropharynx is clear. No oropharyngeal exudate or posterior oropharyngeal erythema.  Eyes:     General:        Right eye: No discharge.        Left eye: No discharge.     Extraocular Movements: Extraocular movements intact.     Conjunctiva/sclera: Conjunctivae normal.     Pupils: Pupils are equal, round, and reactive to light.  Neck:     Vascular: No carotid bruit.     Trachea: No tracheal deviation.  Cardiovascular:     Rate and Rhythm: Normal rate and regular rhythm.      Pulses: Normal pulses.     Heart sounds: Normal heart sounds. No murmur heard.   No friction rub. No gallop.  Pulmonary:     Effort: Pulmonary effort is normal. No respiratory distress.     Breath sounds: Normal breath sounds. No stridor. No wheezing, rhonchi or rales.  Chest:     Chest wall: No tenderness.  Abdominal:     General: Bowel sounds  are normal. There is no distension.     Palpations: Abdomen is soft. There is no mass.     Tenderness: There is no abdominal tenderness. There is no right CVA tenderness, left CVA tenderness, guarding or rebound.     Hernia: No hernia is present.  Musculoskeletal:        General: No swelling, tenderness, deformity or signs of injury. Normal range of motion.     Right lower leg: No edema.     Left lower leg: No edema.  Lymphadenopathy:     Cervical: No cervical adenopathy.  Skin:    General: Skin is warm and dry.     Capillary Refill: Capillary refill takes less than 2 seconds.     Coloration: Skin is not jaundiced or pale.     Findings: No bruising, erythema, lesion or rash.  Neurological:     General: No focal deficit present.     Mental Status: He is alert and oriented to person, place, and time.     Cranial Nerves: No cranial nerve deficit.     Sensory: No sensory deficit.     Motor: No weakness.     Coordination: Coordination normal.     Gait: Gait normal.     Deep Tendon Reflexes: Reflexes normal.  Psychiatric:        Mood and Affect: Mood normal.        Behavior: Behavior normal.        Thought Content: Thought content normal.        Judgment: Judgment normal.      Assessment & Plan:  1. Routine general medical examination at a health care facility - Continue to exercise and eat healthy  - Follow up in one year or sooner if needed - CBC with Differential/Platelet; Future - Lipid panel; Future - TSH; Future  2. Mixed hyperlipidemia - likely need to go back on statin  - CBC with Differential/Platelet; Future - Lipid  panel; Future - TSH; Future  3. Anxiety - He will restart Zoloft 50 mg daily - CBC with Differential/Platelet; Future - Lipid panel; Future - TSH; Future  4. Encounter for screening for HIV  - HIV Antibody (routine testing w rflx); Future  5. Prostate cancer screening  - PSA; Future  6. Hypertension, unspecified type - Will place back on lisinopril  - Check BP at home  - CBC with Differential/Platelet; Future - Lipid panel; Future - TSH; Future - lisinopril (ZESTRIL) 5 MG tablet; Take 1 tablet (5 mg total) by mouth daily.  Dispense: 90 tablet; Refill: 3  Shirline Frees, NP

## 2022-01-07 LAB — HIV ANTIBODY (ROUTINE TESTING W REFLEX): HIV 1&2 Ab, 4th Generation: NONREACTIVE

## 2022-08-31 ENCOUNTER — Other Ambulatory Visit: Payer: Self-pay | Admitting: Adult Health

## 2022-10-27 ENCOUNTER — Other Ambulatory Visit: Payer: Self-pay | Admitting: Adult Health

## 2022-10-27 DIAGNOSIS — F419 Anxiety disorder, unspecified: Secondary | ICD-10-CM

## 2022-10-27 NOTE — Telephone Encounter (Signed)
Rx sent in for 90 days. Pt needs a CPE for further refills.
